# Patient Record
Sex: Male | Born: 1970
Health system: Southern US, Community
[De-identification: ages and names within clinical notes are randomized; demographics above are authoritative.]

## PROBLEM LIST (undated history)

## (undated) DIAGNOSIS — E119 Type 2 diabetes mellitus without complications: Secondary | ICD-10-CM

## (undated) DIAGNOSIS — I10 Essential (primary) hypertension: Secondary | ICD-10-CM

## (undated) DIAGNOSIS — K8020 Calculus of gallbladder without cholecystitis without obstruction: Secondary | ICD-10-CM

## (undated) DIAGNOSIS — L0291 Cutaneous abscess, unspecified: Secondary | ICD-10-CM

## (undated) HISTORY — PX: CARDIAC SURGERY: SHX584

---

## 2002-01-23 ENCOUNTER — Emergency Department (HOSPITAL_COMMUNITY): Admission: EM | Admit: 2002-01-23 | Discharge: 2002-01-23 | Payer: Self-pay | Admitting: Emergency Medicine

## 2003-04-09 ENCOUNTER — Emergency Department (HOSPITAL_COMMUNITY): Admission: EM | Admit: 2003-04-09 | Discharge: 2003-04-10 | Payer: Self-pay | Admitting: Emergency Medicine

## 2004-09-01 ENCOUNTER — Emergency Department (HOSPITAL_COMMUNITY): Admission: EM | Admit: 2004-09-01 | Discharge: 2004-09-01 | Payer: Self-pay | Admitting: Emergency Medicine

## 2012-01-28 ENCOUNTER — Encounter (HOSPITAL_COMMUNITY): Payer: Self-pay

## 2012-01-28 ENCOUNTER — Emergency Department (HOSPITAL_COMMUNITY)
Admission: EM | Admit: 2012-01-28 | Discharge: 2012-01-28 | Disposition: A | Discharge status: Home / Self Care | Payer: Non-veteran care | Attending: Emergency Medicine | Admitting: Emergency Medicine

## 2012-01-28 ENCOUNTER — Emergency Department (HOSPITAL_COMMUNITY): Payer: Non-veteran care

## 2012-01-28 DIAGNOSIS — K529 Noninfective gastroenteritis and colitis, unspecified: Secondary | ICD-10-CM

## 2012-01-28 DIAGNOSIS — R197 Diarrhea, unspecified: Secondary | ICD-10-CM | POA: Insufficient documentation

## 2012-01-28 DIAGNOSIS — K5289 Other specified noninfective gastroenteritis and colitis: Secondary | ICD-10-CM | POA: Insufficient documentation

## 2012-01-28 DIAGNOSIS — E119 Type 2 diabetes mellitus without complications: Secondary | ICD-10-CM | POA: Insufficient documentation

## 2012-01-28 DIAGNOSIS — Z794 Long term (current) use of insulin: Secondary | ICD-10-CM | POA: Insufficient documentation

## 2012-01-28 HISTORY — DX: Type 2 diabetes mellitus without complications: E11.9

## 2012-01-28 LAB — CBC
MCH: 27.6 pg (ref 26.0–34.0)
MCHC: 34.1 g/dL (ref 30.0–36.0)
Platelets: 171 10*3/uL (ref 150–400)
RBC: 5.29 MIL/uL (ref 4.22–5.81)
RDW: 12.9 % (ref 11.5–15.5)

## 2012-01-28 LAB — COMPREHENSIVE METABOLIC PANEL
ALT: 18 U/L (ref 0–53)
AST: 19 U/L (ref 0–37)
Calcium: 9.2 mg/dL (ref 8.4–10.5)
Creatinine, Ser: 0.96 mg/dL (ref 0.50–1.35)
Sodium: 136 mEq/L (ref 135–145)
Total Protein: 7.5 g/dL (ref 6.0–8.3)

## 2012-01-28 MED ORDER — IOHEXOL 300 MG/ML  SOLN
100.0000 mL | Freq: Once | INTRAMUSCULAR | Status: AC | PRN
  Administered 2012-01-28: 100 mL via INTRAVENOUS

## 2012-01-28 MED ORDER — SODIUM CHLORIDE 0.9 % IV BOLUS (SEPSIS)
1000.0000 mL | Freq: Once | INTRAVENOUS | Status: AC
  Administered 2012-01-28: 1000 mL via INTRAVENOUS

## 2012-01-28 NOTE — ED Notes (Signed)
Per pt I have had diarrhea for 3 days now. Afebrile. No nausea or vomiting. Diarrhea has had pungent odor. Brown loose consistency. Upper mid abdominal pain.  Bowel sounds all four quads.

## 2012-01-28 NOTE — ED Notes (Addendum)
Patient reports that he has had 20 diarrheal stools in the past 24 hours and having epigastric pain. Patient states that his diarrhea has a foul odor and a foul odor when he "burps". Patient denies vomiting or fever. Patient's friend states that she had something similar a week ago.

## 2012-01-28 NOTE — ED Provider Notes (Signed)
History     CSN: [id number]  Arrival date & time 01/28/12  1103   First MD Initiated Contact with Patient 01/28/12 1147      Chief Complaint  Patient presents with  . Abdominal Pain  . Diarrhea    (Consider location/radiation/quality/duration/timing/severity/associated sxs/prior treatment) HPI Pt presents with c/o multiple episodes of diarrhea over the past 1-2 days.  No blood in stool.  Has had some nausea, no vomiting.  Today he has had increased belching.  He also notes feeling lots of gas pains.  No fever/chills.  Diarrhea has slowed down as of early this morning.  His girlfriend notes that she has had similar symptoms that resolved.  Pt has not had any treatment prior to arrival.  There are no other associated systemic symptoms, there are no other alleviating or modifying factors.   Past Medical History  Diagnosis Date  . Diabetes mellitus without complication     History reviewed. No pertinent past surgical history.  Family History  Problem Relation Age of Onset  . Diabetes Mother   . Heart failure Father     History  Substance Use Topics  . Smoking status: Never Smoker   . Smokeless tobacco: Not on file  . Alcohol Use: No      Review of Systems ROS reviewed and all otherwise negative except for mentioned in HPI  Allergies  Food  Home Medications   Current Outpatient Rx  Name Route Sig Dispense Refill  . VITAMIN D 1000 UNITS PO TABS Oral Take 1,000 Units by mouth daily.    . INSULIN ASPART 100 UNIT/ML [place] SOLN Subcutaneous Inject 20-25 Units into the skin 3 (three) times daily before meals. Pt takes 20 units at breakfast 20 units at lunch 25 at bedtime    . INSULIN GLARGINE 100 UNIT/ML  SOLN Subcutaneous Inject 52 Units into the skin 2 (two) times daily.      BP 137/95  Pulse 80  Temp 98.5 F (36.9 C) (Oral)  Resp 18  SpO2 99% Vitals reviewed Physical Exam Physical Examination: General appearance - alert, well appearing, and in no  distress Mental status - alert, oriented to person, place, and time Eyes - no conjunctival injection, no scleral icterus Mouth - mucous membranes moist, pharynx normal without lesions Chest - clear to auscultation, no wheezes, rales or rhonchi, symmetric air entry Heart - normal rate, regular rhythm, normal S1, S2, no murmurs, rubs, clicks or gallops Abdomen - soft, nontender, nondistended, no masses or organomegaly Extremities - peripheral pulses normal, no pedal edema, no clubbing or cyanosis Skin - normal coloration and turgor, no rashes  ED Course  Procedures (including critical care time  Labs Reviewed  GLUCOSE, CAPILLARY - Abnormal; Notable for the following:    Glucose-Capillary 161 (*)     All other components within normal limits  COMPREHENSIVE METABOLIC PANEL - Abnormal; Notable for the following:    Glucose, Bld 153 (*)     All other components within normal limits  CBC  LIPASE, BLOOD  LAB REPORT - SCANNED   Ct Abdomen Pelvis W Contrast  01/28/2012  *RADIOLOGY REPORT*  Clinical Data: Abdominal pain, diarrhea  CT ABDOMEN AND PELVIS WITH CONTRAST  Technique:  Multidetector CT imaging of the abdomen and pelvis was performed following the standard protocol during bolus administration of intravenous contrast.  Contrast: OMNIPAQUE IOHEXOL 300 MG/ML  SOLN  Comparison: 01/28/2012 the  Findings: Dependent basilar atelectasis.  Normal heart size.  No pericardial or pleural effusion.  Abdomen:  Liver, gallbladder, biliary system, pancreas, spleen, adrenal glands, and kidneys are within normal limits and demonstrate no acute finding.  Punctate nonobstructing left upper pole renal calculus measuring 2 mm.  Mildly prominent scattered central mesenteric lymph nodes without definite pathologic enlargement or adenopathy.  Negative for bowel obstruction, dilatation, ileus pattern or free air.  Scattered small bowel air fluid levels without significant dilatation compatible with ongoing  diarrhea.  No bowel wall thickening demonstrated or visualized pneumatosis.  Normal appendix demonstrated.  Pelvis:  No pelvic free fluid, fluid collection, hemorrhage, adenopathy, inguinal abnormality, hernia.  Urinary bladder unremarkable.  IMPRESSION: No acute intra-abdominal or pelvic process by CT.   Original Report Authenticated By: Judie Petit. Ruel Favors, M.D.      1. Diarrhea   2. Enteritis       MDM  Pt with diarrhea x several days which has been improving today.  Pt here today with gas pains.  Xray showed possible signs of obstruction, no acute process on CT scan.  Pt tolerating fluids in the ED.  Discharged with strict return precautions.  Pt agreeable with plan.        Ethelda Chick, MD 01/30/12 1340

## 2012-03-22 ENCOUNTER — Emergency Department (HOSPITAL_COMMUNITY)
Admission: EM | Admit: 2012-03-22 | Discharge: 2012-03-22 | Disposition: A | Discharge status: Home / Self Care | Payer: Non-veteran care | Attending: Emergency Medicine | Admitting: Emergency Medicine

## 2012-03-22 ENCOUNTER — Encounter (HOSPITAL_COMMUNITY): Payer: Self-pay | Admitting: Emergency Medicine

## 2012-03-22 DIAGNOSIS — J111 Influenza due to unidentified influenza virus with other respiratory manifestations: Secondary | ICD-10-CM | POA: Insufficient documentation

## 2012-03-22 DIAGNOSIS — J069 Acute upper respiratory infection, unspecified: Secondary | ICD-10-CM | POA: Insufficient documentation

## 2012-03-22 DIAGNOSIS — R05 Cough: Secondary | ICD-10-CM | POA: Insufficient documentation

## 2012-03-22 DIAGNOSIS — Z794 Long term (current) use of insulin: Secondary | ICD-10-CM | POA: Insufficient documentation

## 2012-03-22 DIAGNOSIS — J029 Acute pharyngitis, unspecified: Secondary | ICD-10-CM | POA: Insufficient documentation

## 2012-03-22 DIAGNOSIS — E119 Type 2 diabetes mellitus without complications: Secondary | ICD-10-CM | POA: Insufficient documentation

## 2012-03-22 DIAGNOSIS — IMO0001 Reserved for inherently not codable concepts without codable children: Secondary | ICD-10-CM | POA: Insufficient documentation

## 2012-03-22 DIAGNOSIS — Z79899 Other long term (current) drug therapy: Secondary | ICD-10-CM | POA: Insufficient documentation

## 2012-03-22 DIAGNOSIS — R059 Cough, unspecified: Secondary | ICD-10-CM | POA: Insufficient documentation

## 2012-03-22 LAB — RAPID STREP SCREEN (MED CTR MEBANE ONLY): Streptococcus, Group A Screen (Direct): NEGATIVE

## 2012-03-22 MED ORDER — ACETAMINOPHEN 325 MG PO TABS
650.0000 mg | ORAL_TABLET | Freq: Once | ORAL | Status: AC
  Administered 2012-03-22: 650 mg via ORAL
  Filled 2012-03-22: qty 2

## 2012-03-22 MED ORDER — HYDROCODONE-ACETAMINOPHEN 7.5-325 MG/15ML PO SOLN: 15.0000 mL | Freq: Three times a day (TID) | ORAL | Status: DC | PRN

## 2012-03-22 MED ORDER — KETOROLAC TROMETHAMINE 60 MG/2ML IM SOLN
60.0000 mg | Freq: Once | INTRAMUSCULAR | Status: AC
  Administered 2012-03-22: 60 mg via INTRAMUSCULAR
  Filled 2012-03-22: qty 2

## 2012-03-22 MED ORDER — OSELTAMIVIR PHOSPHATE 75 MG PO CAPS: 75.0000 mg | ORAL_CAPSULE | Freq: Two times a day (BID) | ORAL | Status: DC

## 2012-03-22 NOTE — ED Notes (Signed)
MD at bedside. 

## 2012-03-22 NOTE — ED Provider Notes (Signed)
History     CSN: [id number]  Arrival date & time 03/22/12  0435   First MD Initiated Contact with Patient 03/22/12 [phone]      Chief Complaint  Patient presents with  . Sore Throat  . Fever    (Consider location/radiation/quality/duration/timing/severity/associated sxs/prior treatment) HPI Comments: 41 year old male with a history of diabetes who presents with a complaint of fever. He states this started last night when he got home from work, has been persistent, not improved with any interventions, associated with a sore throat and a cough. He has diffuse myalgias as well. He notes that he has a child who has similar symptoms.  Patient is a 41 y.o. male presenting with pharyngitis and fever. The history is provided by the patient and a relative.  Sore Throat  Fever Primary symptoms of the febrile illness include fever, cough and myalgias. Primary symptoms do not include nausea or vomiting.    Past Medical History  Diagnosis Date  . Diabetes mellitus without complication     History reviewed. No pertinent past surgical history.  Family History  Problem Relation Age of Onset  . Diabetes Mother   . Heart failure Father     History  Substance Use Topics  . Smoking status: Never Smoker   . Smokeless tobacco: Not on file  . Alcohol Use: No      Review of Systems  Constitutional: Positive for fever.  HENT: Positive for sore throat.   Respiratory: Positive for cough.   Gastrointestinal: Negative for nausea and vomiting.  Musculoskeletal: Positive for myalgias.    Allergies  Food  Home Medications   Current Outpatient Rx  Name  Route  Sig  Dispense  Refill  . VITAMIN D 1000 UNITS PO TABS   Oral   Take 1,000 Units by mouth daily.         . INSULIN ASPART 100 UNIT/ML [place] SOLN   Subcutaneous   Inject 20-25 Units into the skin 3 (three) times daily before meals. Pt takes 20 units at breakfast 20 units at lunch 25 at bedtime         . INSULIN GLARGINE 100  UNIT/ML [place] SOLN   Subcutaneous   Inject 52 Units into the skin 2 (two) times daily.         [place] HYDROCODONE-ACETAMINOPHEN 7.5-325 MG/15ML PO SOLN   Oral   Take 15 mLs by mouth every 8 (eight) hours as needed for pain.   120 mL   0   . OSELTAMIVIR PHOSPHATE 75 MG PO CAPS   Oral   Take 1 capsule (75 mg total) by mouth every 12 (twelve) hours.   10 capsule   0     BP 139/82  Pulse 104  Temp 100 F (37.8 C) (Oral)  Resp 20  Wt 285 lb (129.275 kg)  SpO2 95%  Physical Exam  Nursing note and vitals reviewed. Constitutional: He appears well-developed and well-nourished. No distress.  HENT:  Head: Normocephalic and atraumatic.  Mouth/Throat: No oropharyngeal exudate.       Pharynx is erythematous but there is no exudate, hypertrophy or asymmetry. Mucous membranes are moist  Eyes: Conjunctivae normal and EOM are normal. Pupils are equal, round, and reactive to light. Right eye exhibits no discharge. Left eye exhibits no discharge. No scleral icterus.  Neck: Normal range of motion. Neck supple. No JVD present. No thyromegaly present.       No cervical lymphadenopathy  Cardiovascular: Regular rhythm, normal heart sounds and intact distal pulses.  Exam reveals no gallop and no friction rub.   No murmur heard.      Mild tachycardia to 105  Pulmonary/Chest: Effort normal and breath sounds normal. No respiratory distress. He has no wheezes. He has no rales.       Lungs are clear bilaterally  Abdominal: Soft. Bowel sounds are normal. He exhibits no distension and no mass. There is no tenderness.  Musculoskeletal: Normal range of motion. He exhibits no edema and no tenderness.  Lymphadenopathy:    He has no cervical adenopathy.  Neurological: He is alert. Coordination normal.  Skin: Skin is warm and dry. No rash noted. No erythema.  Psychiatric: He has a normal mood and affect. His behavior is normal.    ED Course  Procedures (including critical care time)   Labs Reviewed  RAPID  STREP SCREEN   No results found.   1. Influenza-like illness       MDM  The patient has a flulike illness with fever and upper respiratory symptoms. He has a slight pharyngitis, the nurses have ordered a strep screen, and is in the lab at this time, Toradol and Tylenol given for fever and body aches, patient appears stable overall. Because he is a diabetic we'll treat with abx  Strep test negative, patient given Tamiflu and hydrocodone suspension. Stable for discharge      Vida Roller, MD 03/22/12 [phone]

## 2012-03-22 NOTE — ED Notes (Signed)
Pt alert, arrives from home, c/o sore throat and fever, onset was last PM, resp even unlabored, skin pwd

## 2016-04-28 ENCOUNTER — Emergency Department (HOSPITAL_BASED_OUTPATIENT_CLINIC_OR_DEPARTMENT_OTHER): Payer: Federal, State, Local not specified - PPO

## 2016-04-28 ENCOUNTER — Encounter (HOSPITAL_BASED_OUTPATIENT_CLINIC_OR_DEPARTMENT_OTHER): Payer: Self-pay | Admitting: *Deleted

## 2016-04-28 ENCOUNTER — Emergency Department (HOSPITAL_BASED_OUTPATIENT_CLINIC_OR_DEPARTMENT_OTHER)
Admission: EM | Admit: 2016-04-28 | Discharge: 2016-04-28 | Disposition: A | Discharge status: Home / Self Care | Payer: Federal, State, Local not specified - PPO | Attending: Emergency Medicine | Admitting: Emergency Medicine

## 2016-04-28 DIAGNOSIS — R110 Nausea: Secondary | ICD-10-CM | POA: Diagnosis not present

## 2016-04-28 DIAGNOSIS — E1165 Type 2 diabetes mellitus with hyperglycemia: Secondary | ICD-10-CM | POA: Insufficient documentation

## 2016-04-28 DIAGNOSIS — R079 Chest pain, unspecified: Secondary | ICD-10-CM | POA: Diagnosis not present

## 2016-04-28 DIAGNOSIS — R739 Hyperglycemia, unspecified: Secondary | ICD-10-CM

## 2016-04-28 DIAGNOSIS — R1011 Right upper quadrant pain: Secondary | ICD-10-CM | POA: Diagnosis present

## 2016-04-28 DIAGNOSIS — R1010 Upper abdominal pain, unspecified: Secondary | ICD-10-CM

## 2016-04-28 DIAGNOSIS — Z794 Long term (current) use of insulin: Secondary | ICD-10-CM | POA: Diagnosis not present

## 2016-04-28 DIAGNOSIS — Z79899 Other long term (current) drug therapy: Secondary | ICD-10-CM | POA: Insufficient documentation

## 2016-04-28 LAB — COMPREHENSIVE METABOLIC PANEL
ALK PHOS: 86 U/L (ref 38–126)
ALT: 17 U/L (ref 17–63)
ANION GAP: 8 (ref 5–15)
AST: 18 U/L (ref 15–41)
Albumin: 3.6 g/dL (ref 3.5–5.0)
BUN: 16 mg/dL (ref 6–20)
CALCIUM: 9.3 mg/dL (ref 8.9–10.3)
CO2: 25 mmol/L (ref 22–32)
Chloride: 104 mmol/L (ref 101–111)
Creatinine, Ser: 1.03 mg/dL (ref 0.61–1.24)
GFR calc non Af Amer: >60 mL/min (ref >60)
Glucose, Bld: 469 mg/dL — ABNORMAL HIGH (ref 65–99)
POTASSIUM: 3.8 mmol/L (ref 3.5–5.1)
SODIUM: 137 mmol/L (ref 135–145)
Total Bilirubin: 0.5 mg/dL (ref 0.3–1.2)
Total Protein: 7.2 g/dL (ref 6.5–8.1)

## 2016-04-28 LAB — CBC WITH DIFFERENTIAL/PLATELET
Basophils Absolute: 0.1 10*3/uL (ref 0.0–0.1)
Basophils Relative: 1 %
EOS ABS: 0.2 10*3/uL (ref 0.0–0.7)
Eosinophils Relative: 4 %
HCT: 44.3 % (ref 39.0–52.0)
HEMOGLOBIN: 15.5 g/dL (ref 13.0–17.0)
LYMPHS ABS: 2.2 10*3/uL (ref 0.7–4.0)
Lymphocytes Relative: 37 %
MCH: 27.7 pg (ref 26.0–34.0)
MCHC: 35 g/dL (ref 30.0–36.0)
MCV: 79.1 fL (ref 78.0–100.0)
Monocytes Absolute: 0.4 10*3/uL (ref 0.1–1.0)
Monocytes Relative: 7 %
NEUTROS ABS: 3 10*3/uL (ref 1.7–7.7)
NEUTROS PCT: 51 %
Platelets: 173 10*3/uL (ref 150–400)
RBC: 5.6 MIL/uL (ref 4.22–5.81)
RDW: 12.6 % (ref 11.5–15.5)
WBC: 5.8 10*3/uL (ref 4.0–10.5)

## 2016-04-28 LAB — URINALYSIS, ROUTINE W REFLEX MICROSCOPIC
Bilirubin Urine: NEGATIVE
Glucose, UA: >=500 mg/dL — AB
Ketones, ur: NEGATIVE mg/dL
Leukocytes, UA: NEGATIVE
NITRITE: NEGATIVE
Protein, ur: 30 mg/dL — AB
SPECIFIC GRAVITY, URINE: 1.043 — AB (ref 1.005–1.030)
pH: 5.5 (ref 5.0–8.0)

## 2016-04-28 LAB — LIPASE, BLOOD: Lipase: 37 U/L (ref 11–51)

## 2016-04-28 LAB — URINALYSIS, MICROSCOPIC (REFLEX)
BACTERIA UA: NONE SEEN
SQUAMOUS EPITHELIAL / LPF: NONE SEEN
WBC, UA: NONE SEEN WBC/hpf (ref 0–5)

## 2016-04-28 LAB — TROPONIN I

## 2016-04-28 LAB — CBG MONITORING, ED: Glucose-Capillary: 339 mg/dL — ABNORMAL HIGH (ref 65–99)

## 2016-04-28 MED ORDER — IOPAMIDOL (ISOVUE-300) INJECTION 61%
100.0000 mL | Freq: Once | INTRAVENOUS | Status: AC | PRN
  Administered 2016-04-28: 100 mL via INTRAVENOUS

## 2016-04-28 MED ORDER — TRAMADOL HCL 50 MG PO TABS: 50.0000 mg | ORAL_TABLET | Freq: Four times a day (QID) | ORAL | 0 refills | Status: DC | PRN

## 2016-04-28 MED ORDER — SODIUM CHLORIDE 0.9 % IV BOLUS (SEPSIS)
1000.0000 mL | Freq: Once | INTRAVENOUS | Status: AC
  Administered 2016-04-28: 1000 mL via INTRAVENOUS

## 2016-04-28 MED ORDER — SUCRALFATE 1 G PO TABS: 1.0000 g | ORAL_TABLET | Freq: Three times a day (TID) | ORAL | 0 refills | Status: DC

## 2016-04-28 MED ORDER — INSULIN ASPART 100 UNIT/ML ~~LOC~~ SOLN
10.0000 [IU] | Freq: Once | SUBCUTANEOUS | Status: AC
  Administered 2016-04-28: 10 [IU] via SUBCUTANEOUS
  Filled 2016-04-28: qty 1

## 2016-04-28 MED ORDER — MORPHINE SULFATE (PF) 4 MG/ML IV SOLN
4.0000 mg | Freq: Once | INTRAVENOUS | Status: AC
  Administered 2016-04-28: 4 mg via INTRAVENOUS
  Filled 2016-04-28: qty 1

## 2016-04-28 MED ORDER — ONDANSETRON 4 MG PO TBDP: ORAL_TABLET | ORAL | 0 refills | Status: DC

## 2016-04-28 MED ORDER — ONDANSETRON HCL 4 MG/2ML IJ SOLN
4.0000 mg | Freq: Once | INTRAMUSCULAR | Status: AC
  Administered 2016-04-28: 4 mg via INTRAVENOUS
  Filled 2016-04-28: qty 2

## 2016-04-28 NOTE — ED Triage Notes (Addendum)
rt chest pain radiating to rt arm w rt arm numbness onset last pm  Denies sob, no n/v  States feels like burning, and cant feel hand  Pain increased w movement

## 2016-04-28 NOTE — Discharge Instructions (Signed)
Your blood sugar is elevated. Please take meformin as prescribed.   Continue nexium as prescribed.   Add carafate to help with your stomach.   Take tramadol as needed for pain.   Take zofran as needed for nausea.   [facility] Consider endoscopy with a GI doctor to look at your stomach.   Return to ER if you have severe abdominal pain, vomiting, fever, chest pain

## 2016-04-28 NOTE — ED Provider Notes (Signed)
[facility] Provider Note   CSN: [id number] Arrival date & time: 04/28/16  0557     History   Chief Complaint Chief Complaint  Patient presents with  . Chest Pain    HPI Javier Suarez is a 46 y.o. male.  Patient is a 46 year old male with past medical history of diabetes and obesity. He presents for evaluation of right upper quadrant pain and pain beneath his right ribs. He states that this began approximately one week ago and has worsened. [facility] and given medication which has not helped. He reports nausea but no vomiting. He reports belching that is foul tasting. He denies any fevers or chills. He denies any shortness of breath. He states that his pain radiates into his right arm and tells me that his right hand feels "numb".   The history is provided by the patient.  Chest Pain   This is a new problem. Episode onset: 1 week ago. The problem occurs constantly. The problem has been rapidly worsening. The pain is associated with movement, raising an arm and coughing (Palpation). The pain is severe. The quality of the pain is described as sharp. The pain radiates to the right arm. Associated symptoms include nausea and numbness. Pertinent negatives include no cough, no dizziness, no fever, no palpitations and no shortness of breath. He has tried nothing for the symptoms. The treatment provided no relief.    Past Medical History:  Diagnosis Date  . Diabetes mellitus without complication (HCC)     There are no active problems to display for this patient.   History reviewed. No pertinent surgical history.     Home Medications    Prior to Admission medications   Medication Sig Start Date End Date Taking? Authorizing Provider  metFORMIN (GLUCOPHAGE) 500 MG tablet Take by mouth 2 (two) times daily with a meal.   Yes Historical Provider, MD  cholecalciferol (VITAMIN D) 1000 UNITS tablet Take 1,000 Units by mouth daily.     Historical Provider, MD  hydrocodone-acetaminophen (HYCET) 7.5-325 MG/15ML solution Take 15 mLs by mouth every 8 (eight) hours as needed for pain. 03/22/12   Eber HongBrian Miller, MD  insulin aspart (NOVOLOG) 100 UNIT/ML injection Inject 20-25 Units into the skin 3 (three) times daily before meals. Pt takes 20 units at breakfast 20 units at lunch 25 at bedtime    Historical Provider, MD  insulin glargine (LANTUS) 100 UNIT/ML injection Inject 52 Units into the skin 2 (two) times daily.    Historical Provider, MD  oseltamivir (TAMIFLU) 75 MG capsule Take 1 capsule (75 mg total) by mouth every 12 (twelve) hours. 03/22/12   Eber HongBrian Miller, MD    Family History Family History  Problem Relation Age of Onset  . Diabetes Mother   . Heart failure Father     Social History Social History  Substance Use Topics  . Smoking status: Never Smoker  . Smokeless tobacco: Not on file  . Alcohol use No     Allergies   Food   Review of Systems Review of Systems  Constitutional: Negative for fever.  Respiratory: Negative for cough and shortness of breath.   Cardiovascular: Positive for chest pain. Negative for palpitations.  Gastrointestinal: Positive for nausea.  Neurological: Positive for numbness. Negative for dizziness.  All other systems reviewed and are negative.    Physical Exam Updated Vital Signs BP (!) 150/127 (BP Location: Right Arm)   Pulse 96   Temp 98.8 F (37.1  C) (Oral)   Resp 20   Ht 6\' 1"  (1.854 m)   Wt 250 lb (113.4 kg)   SpO2 100%   BMI 32.98 kg/m   Physical Exam  Constitutional: He is oriented to person, place, and time. He appears well-developed and well-nourished. No distress.  HENT:  Head: Normocephalic and atraumatic.  Mouth/Throat: Oropharynx is clear and moist.  Neck: Normal range of motion. Neck supple.  Cardiovascular: Normal rate and regular rhythm.  Exam reveals no friction rub.   No murmur heard. Pulmonary/Chest: Effort normal and breath sounds normal. No  respiratory distress. He has no wheezes. He has no rales.  Abdominal: Soft. Bowel sounds are normal. He exhibits no distension. There is tenderness. There is no rebound and no guarding.  There is tenderness to palpation in the right upper quadrant.  Musculoskeletal: Normal range of motion. He exhibits no edema.  Neurological: He is alert and oriented to person, place, and time. Coordination normal.  Skin: Skin is warm and dry. He is not diaphoretic.  Nursing note and vitals reviewed.    ED Treatments / Results  Labs (all labs ordered are listed, but only abnormal results are displayed) Labs Reviewed  COMPREHENSIVE METABOLIC PANEL  LIPASE, BLOOD  CBC WITH DIFFERENTIAL/PLATELET  TROPONIN I    EKG  EKG Interpretation None       Radiology No results found.  Procedures Procedures (including critical care time)  Medications Ordered in ED Medications  ondansetron (ZOFRAN) injection 4 mg (not administered)  morphine 4 MG/ML injection 4 mg (not administered)     Initial Impression / Assessment and Plan / ED Course  I have reviewed the triage vital signs and the nursing notes.  Pertinent labs & imaging results that were available during my care of the patient were reviewed by me and considered in my medical decision making (see chart for details).  Clinical Course     Patient presents with right upper quadrant pain. His laboratory studies are reassuring, however he continues to report significant discomfort. He will undergo a CT scan to further evaluate the cause of his discomfort. Care will be signed out to Dr. Silverio Lay awaiting the results of this study.  Final Clinical Impressions(s) / ED Diagnoses   Final diagnoses:  None    New Prescriptions New Prescriptions   No medications on file     Geoffery Lyons, MD 05/01/16 2259

## 2016-04-28 NOTE — ED Provider Notes (Signed)
  Physical Exam  BP 130/98 (BP Location: Right Arm)   Pulse 92   Temp 98.8 F (37.1 C) (Oral)   Resp 18   Ht 6\' 1"  (1.854 m)   Wt 250 lb (113.4 kg)   SpO2 98%   BMI 32.98 kg/m   Physical Exam  ED Course  Procedures  MDM Patient care assumed at 7 am from Dr. Judd Lienelo. Patient has been having R sided chest pain, epigastric pain for the last week. [facility] and is on nexium. Patient has hx of DM but uncompliant with metformin. Sign out pending RUQ US and possible CT ab/pel, labs, and reassessment. Bedside US showed nl gallbladder. Still had pain so ordered CT ab/pel. CT showed hepatomegaly otherwise unremarkable. Labs showed glucose 469, nl AG. After 1 L NS bolus and regular insulin SQ, CBG down to 339. UA nl. Delta trop neg. Pain controlled. I think likely gastritis vs gastric ulcer from uncontrolled DM. Told him to take metformin twice daily as prescribed and continue nexium, add carafate as needed. Prn tramadol for pain. [facility] for further evaluation.       Javier Panderavid Hsienta Calven Gilkes, MD 04/28/16 [phone]

## 2016-05-07 ENCOUNTER — Telehealth (HOSPITAL_BASED_OUTPATIENT_CLINIC_OR_DEPARTMENT_OTHER): Payer: Self-pay | Admitting: *Deleted

## 2016-05-07 NOTE — Telephone Encounter (Signed)
Spoke with pharmacy in [place] [facility]. Requesting zofran RX written here be changed from ODT to regular tablets. VORB from Dr. Rubin PayorPickering to do so.

## 2016-05-22 ENCOUNTER — Emergency Department (HOSPITAL_BASED_OUTPATIENT_CLINIC_OR_DEPARTMENT_OTHER)
Admission: EM | Admit: 2016-05-22 | Discharge: 2016-05-22 | Disposition: A | Discharge status: Home / Self Care | Payer: Federal, State, Local not specified - PPO | Attending: Emergency Medicine | Admitting: Emergency Medicine

## 2016-05-22 ENCOUNTER — Emergency Department (HOSPITAL_BASED_OUTPATIENT_CLINIC_OR_DEPARTMENT_OTHER): Payer: Federal, State, Local not specified - PPO

## 2016-05-22 ENCOUNTER — Encounter (HOSPITAL_BASED_OUTPATIENT_CLINIC_OR_DEPARTMENT_OTHER): Payer: Self-pay | Admitting: Emergency Medicine

## 2016-05-22 DIAGNOSIS — R197 Diarrhea, unspecified: Secondary | ICD-10-CM | POA: Diagnosis not present

## 2016-05-22 DIAGNOSIS — E119 Type 2 diabetes mellitus without complications: Secondary | ICD-10-CM | POA: Diagnosis not present

## 2016-05-22 DIAGNOSIS — K921 Melena: Secondary | ICD-10-CM | POA: Insufficient documentation

## 2016-05-22 DIAGNOSIS — R112 Nausea with vomiting, unspecified: Secondary | ICD-10-CM | POA: Diagnosis not present

## 2016-05-22 DIAGNOSIS — Z79899 Other long term (current) drug therapy: Secondary | ICD-10-CM | POA: Insufficient documentation

## 2016-05-22 DIAGNOSIS — R1013 Epigastric pain: Secondary | ICD-10-CM | POA: Insufficient documentation

## 2016-05-22 DIAGNOSIS — Z794 Long term (current) use of insulin: Secondary | ICD-10-CM | POA: Insufficient documentation

## 2016-05-22 LAB — COMPREHENSIVE METABOLIC PANEL
ALT: 14 U/L — ABNORMAL LOW (ref 17–63)
AST: 15 U/L (ref 15–41)
Albumin: 3.3 g/dL — ABNORMAL LOW (ref 3.5–5.0)
Alkaline Phosphatase: 57 U/L (ref 38–126)
Anion gap: 5 (ref 5–15)
BUN: 18 mg/dL (ref 6–20)
CHLORIDE: 101 mmol/L (ref 101–111)
CO2: 29 mmol/L (ref 22–32)
Calcium: 8.8 mg/dL — ABNORMAL LOW (ref 8.9–10.3)
Creatinine, Ser: 1.07 mg/dL (ref 0.61–1.24)
Glucose, Bld: 378 mg/dL — ABNORMAL HIGH (ref 65–99)
POTASSIUM: 3.6 mmol/L (ref 3.5–5.1)
Sodium: 135 mmol/L (ref 135–145)
Total Bilirubin: 0.7 mg/dL (ref 0.3–1.2)
Total Protein: 6.6 g/dL (ref 6.5–8.1)

## 2016-05-22 LAB — CBC
HCT: 41.2 % (ref 39.0–52.0)
HEMOGLOBIN: 13.7 g/dL (ref 13.0–17.0)
MCH: 27.4 pg (ref 26.0–34.0)
MCHC: 33.3 g/dL (ref 30.0–36.0)
MCV: 82.4 fL (ref 78.0–100.0)
Platelets: 159 10*3/uL (ref 150–400)
RBC: 5 MIL/uL (ref 4.22–5.81)
RDW: 13 % (ref 11.5–15.5)
WBC: 4.8 10*3/uL (ref 4.0–10.5)

## 2016-05-22 LAB — URINALYSIS, ROUTINE W REFLEX MICROSCOPIC
Bilirubin Urine: NEGATIVE
Ketones, ur: NEGATIVE mg/dL
LEUKOCYTES UA: NEGATIVE
Nitrite: NEGATIVE
PH: 6 (ref 5.0–8.0)
PROTEIN: 100 mg/dL — AB
Specific Gravity, Urine: 1.046 — ABNORMAL HIGH (ref 1.005–1.030)

## 2016-05-22 LAB — URINALYSIS, MICROSCOPIC (REFLEX)

## 2016-05-22 LAB — CBG MONITORING, ED: GLUCOSE-CAPILLARY: 265 mg/dL — AB (ref 65–99)

## 2016-05-22 LAB — LIPASE, BLOOD: LIPASE: 45 U/L (ref 11–51)

## 2016-05-22 LAB — OCCULT BLOOD X 1 CARD TO LAB, STOOL: Fecal Occult Bld: NEGATIVE

## 2016-05-22 MED ORDER — LOPERAMIDE HCL 2 MG PO CAPS: 2.0000 mg | ORAL_CAPSULE | Freq: Four times a day (QID) | ORAL | 0 refills | Status: DC | PRN

## 2016-05-22 MED ORDER — SODIUM CHLORIDE 0.9 % IV BOLUS (SEPSIS)
1000.0000 mL | Freq: Once | INTRAVENOUS | Status: AC
  Administered 2016-05-22: 1000 mL via INTRAVENOUS

## 2016-05-22 MED ORDER — FAMOTIDINE IN NACL 20-0.9 MG/50ML-% IV SOLN
20.0000 mg | Freq: Once | INTRAVENOUS | Status: AC
  Administered 2016-05-22: 20 mg via INTRAVENOUS
  Filled 2016-05-22: qty 50

## 2016-05-22 MED ORDER — GLIPIZIDE 10 MG PO TABS: 10.0000 mg | ORAL_TABLET | Freq: Every day | ORAL | 0 refills | Status: DC

## 2016-05-22 MED ORDER — PANTOPRAZOLE SODIUM 40 MG IV SOLR
40.0000 mg | Freq: Once | INTRAVENOUS | Status: AC
  Administered 2016-05-22: 40 mg via INTRAVENOUS
  Filled 2016-05-22: qty 40

## 2016-05-22 MED ORDER — INSULIN REGULAR HUMAN 100 UNIT/ML IJ SOLN
5.0000 [IU] | Freq: Once | INTRAMUSCULAR | Status: AC
  Administered 2016-05-22: 5 [IU] via SUBCUTANEOUS
  Filled 2016-05-22: qty 1

## 2016-05-22 MED ORDER — ONDANSETRON HCL 4 MG/2ML IJ SOLN
4.0000 mg | Freq: Once | INTRAMUSCULAR | Status: DC
Start: 2016-05-22 — End: 2016-05-22
  Filled 2016-05-22: qty 2

## 2016-05-22 MED ORDER — GI COCKTAIL ~~LOC~~
30.0000 mL | Freq: Once | ORAL | Status: AC
  Administered 2016-05-22: 30 mL via ORAL
  Filled 2016-05-22: qty 30

## 2016-05-22 MED ORDER — SUCRALFATE 1 G PO TABS: 1.0000 g | ORAL_TABLET | Freq: Three times a day (TID) | ORAL | 0 refills | Status: DC

## 2016-05-22 NOTE — ED Notes (Signed)
Patient transported to X-ray 

## 2016-05-22 NOTE — Discharge Instructions (Signed)
Continue metformin.   Add glipizide to control blood sugar.   Stay hydrated.   Continue nexium. Take carafate as prescribed.   Take imodium for diarrhea, up to 10 times daily.   See your GI doctor  Return to ER if you have worse vomiting, abdominal pain, fevers, worse bloody or black stools.

## 2016-05-22 NOTE — ED Triage Notes (Signed)
Vomiting and diarrhea since last night.  Diarrhea is black.

## 2016-05-22 NOTE — ED Provider Notes (Signed)
[facility] Provider Note   CSN: [id number] Arrival date & time: 05/22/16  1635  By signing my name below, I, Modena Jansky, attest that this documentation has been prepared under the direction and in the presence of Charlynne Pander, MD. Electronically Signed: Modena Jansky, Scribe. 05/22/2016. 6:05 PM.  History   Chief Complaint Chief Complaint  Patient presents with  . Emesis   The history is provided by the patient and medical records. No language interpreter was used.   HPI Comments: Javier Suarez is a 46 y.o. male with a PMHx of DM who presents to the Emergency Department complaining of intermittent vomiting that started last night. He was seen in the ED 2 weeks ago for abdominal pain and uncontrolled blood sugar. He filled his metformin prescribed then and he is currently on nexium. He hasn't seen GI yet but has an upcoming appointment with GI at the [place].  He states he vomited twice last night and another two times today. He describes the emesis as red in color. He has been having multiple episodes of associated diarrhea, described as black. He had black stools last night and today. No modifying factors. He denies any recent travel or other complaints. Not on blood thinners. Seen in the ED 2 weeks ago and had CT ab/pel that was unremarkable.   Past Medical History:  Diagnosis Date  . Diabetes mellitus without complication (HCC)     There are no active problems to display for this patient.   History reviewed. No pertinent surgical history.     Home Medications    Prior to Admission medications   Medication Sig Start Date End Date Taking? Authorizing Provider  Liraglutide (VICTOZA [place]) Inject into the skin.   Yes Historical Provider, MD  cholecalciferol (VITAMIN D) 1000 UNITS tablet Take 1,000 Units by mouth daily.    Historical Provider, MD  hydrocodone-acetaminophen (HYCET) 7.5-325 MG/15ML solution Take 15 mLs by mouth every 8 (eight) hours as needed for pain.  03/22/12   Eber Hong, MD  insulin aspart (NOVOLOG) 100 UNIT/ML injection Inject 20-25 Units into the skin 3 (three) times daily before meals. Pt takes 20 units at breakfast 20 units at lunch 25 at bedtime    Historical Provider, MD  insulin glargine (LANTUS) 100 UNIT/ML injection Inject 52 Units into the skin 2 (two) times daily.    Historical Provider, MD  metFORMIN (GLUCOPHAGE) 500 MG tablet Take by mouth 2 (two) times daily with a meal.    Historical Provider, MD  omeprazole (PRILOSEC) 20 MG capsule Take 20 mg by mouth daily.    Historical Provider, MD  ondansetron (ZOFRAN ODT) 4 MG disintegrating tablet 4mg  ODT q6 hours prn nausea/vomit 04/28/16   Charlynne Pander, MD  oseltamivir (TAMIFLU) 75 MG capsule Take 1 capsule (75 mg total) by mouth every 12 (twelve) hours. 03/22/12   Eber Hong, MD  sucralfate (CARAFATE) 1 g tablet Take 1 tablet (1 g total) by mouth 4 (four) times daily -  with meals and at bedtime. 04/28/16   Charlynne Pander, MD  traMADol (ULTRAM) 50 MG tablet Take 1 tablet (50 mg total) by mouth every 6 (six) hours as needed. 04/28/16   Charlynne Pander, MD    Family History Family History  Problem Relation Age of Onset  . Diabetes Mother   . Heart failure Father     Social History Social History  Substance Use Topics  . Smoking status: Never Smoker  . Smokeless tobacco: Never Used  .  Alcohol use No     Allergies   Food   Review of Systems Review of Systems  Gastrointestinal: Positive for blood in stool, diarrhea and vomiting.   Physical Exam Updated Vital Signs BP (!) 149/105 (BP Location: Right Arm)   Pulse 92   Temp 98.4 F (36.9 C) (Oral)   Resp 16   Ht 6\' 1"  (1.854 m)   Wt 245 lb (111.1 kg)   SpO2 99%   BMI 32.32 kg/m   Physical Exam  Constitutional: He appears well-developed and well-nourished. No distress.  HENT:  Head: Normocephalic and atraumatic.  Mouth/Throat: Mucous membranes are not dry.  Eyes: Conjunctivae are normal.  Neck: Neck  supple.  Cardiovascular: Normal rate.   Pulmonary/Chest: Effort normal.  Abdominal: Soft. There is tenderness.  Mild epigastric TTP.   Genitourinary:  Genitourinary Comments: Rectal exam: Dark stools, no hemorrhoids.  Musculoskeletal: Normal range of motion.  Neurological: He is alert.  Skin: Skin is warm and dry.  Psychiatric: He has a normal mood and affect.  Nursing note and vitals reviewed.    ED Treatments / Results  DIAGNOSTIC STUDIES: Oxygen Saturation is 99% on RA, normal by my interpretation.    COORDINATION OF CARE: 6:09 PM- Pt advised of plan for treatment and pt agrees.  Labs (all labs ordered are listed, but only abnormal results are displayed) Labs Reviewed  COMPREHENSIVE METABOLIC PANEL - Abnormal; Notable for the following:       Result Value   Glucose, Bld 378 (*)    Calcium 8.8 (*)    Albumin 3.3 (*)    ALT 14 (*)    All other components within normal limits  LIPASE, BLOOD  CBC  URINALYSIS, ROUTINE W REFLEX MICROSCOPIC  OCCULT BLOOD X 1 CARD TO LAB, STOOL    EKG  EKG Interpretation None       Radiology No results found.  Procedures Procedures (including critical care time)  Medications Ordered in ED Medications  ondansetron (ZOFRAN) injection 4 mg (not administered)  famotidine (PEPCID) IVPB 20 mg premix (20 mg Intravenous New Bag/Given 05/22/16 1819)  sodium chloride 0.9 % bolus 1,000 mL (0 mLs Intravenous Stopped 05/22/16 1815)  insulin regular (NOVOLIN R,HUMULIN R) 250 units/2.235mL (100 units/mL) injection 5 Units (5 Units Subcutaneous Given 05/22/16 1815)  pantoprazole (PROTONIX) injection 40 mg (40 mg Intravenous Given 05/22/16 1815)  gi cocktail (Maalox,Lidocaine,Donnatal) (30 mLs Oral Given 05/22/16 1819)     Initial Impression / Assessment and Plan / ED Course  I have reviewed the triage vital signs and the nursing notes.  Pertinent labs & imaging results that were available during my care of the patient were reviewed by me and  considered in my medical decision making (see chart for details).     Javier Suarez is a 46 y.o. male here with vomiting, diarrhea with dark stool. Likely gastroenteritis. Consider bleeding ulcer as well. Abdomen nontender. Will get labs, occ stool.   8:13 PM CBC showed Hg 13.7, similar to 2 weeks ago.Occ negative. Glucose 378, nl AG, dec to 265 after IVF and insulin. Will add glipizide to control diabetes and add carafate. No vomiting in the ED. He is already on nexium and has GI follow up. I think likely mild gastritis causing his symptoms.    Final Clinical Impressions(s) / ED Diagnoses   Final diagnoses:  None    New Prescriptions New Prescriptions   No medications on file   I personally performed the services described in this documentation, which  was scribed in my presence. The recorded information has been reviewed and is accurate.     Charlynne Pander, MD 05/22/16 Elisha Ponder

## 2016-09-06 ENCOUNTER — Emergency Department (HOSPITAL_BASED_OUTPATIENT_CLINIC_OR_DEPARTMENT_OTHER): Payer: Federal, State, Local not specified - PPO

## 2016-09-06 ENCOUNTER — Encounter (HOSPITAL_BASED_OUTPATIENT_CLINIC_OR_DEPARTMENT_OTHER): Payer: Self-pay

## 2016-09-06 ENCOUNTER — Emergency Department (HOSPITAL_BASED_OUTPATIENT_CLINIC_OR_DEPARTMENT_OTHER)
Admission: EM | Admit: 2016-09-06 | Discharge: 2016-09-07 | Disposition: A | Discharge status: Home / Self Care | Payer: Federal, State, Local not specified - PPO | Attending: Emergency Medicine | Admitting: Emergency Medicine

## 2016-09-06 DIAGNOSIS — Z794 Long term (current) use of insulin: Secondary | ICD-10-CM | POA: Diagnosis not present

## 2016-09-06 DIAGNOSIS — Z79899 Other long term (current) drug therapy: Secondary | ICD-10-CM | POA: Insufficient documentation

## 2016-09-06 DIAGNOSIS — I10 Essential (primary) hypertension: Secondary | ICD-10-CM | POA: Diagnosis not present

## 2016-09-06 DIAGNOSIS — R1011 Right upper quadrant pain: Secondary | ICD-10-CM

## 2016-09-06 DIAGNOSIS — E119 Type 2 diabetes mellitus without complications: Secondary | ICD-10-CM | POA: Insufficient documentation

## 2016-09-06 HISTORY — DX: Calculus of gallbladder without cholecystitis without obstruction: K80.20

## 2016-09-06 LAB — COMPREHENSIVE METABOLIC PANEL
ALBUMIN: 3.7 g/dL (ref 3.5–5.0)
ALK PHOS: 72 U/L (ref 38–126)
ALT: 17 U/L (ref 17–63)
AST: 17 U/L (ref 15–41)
Anion gap: 7 (ref 5–15)
BILIRUBIN TOTAL: 0.2 mg/dL — AB (ref 0.3–1.2)
BUN: 17 mg/dL (ref 6–20)
CALCIUM: 9.2 mg/dL (ref 8.9–10.3)
CO2: 30 mmol/L (ref 22–32)
Chloride: 100 mmol/L — ABNORMAL LOW (ref 101–111)
Creatinine, Ser: 1.16 mg/dL (ref 0.61–1.24)
GFR calc Af Amer: >60 mL/min (ref >60)
GLUCOSE: 329 mg/dL — AB (ref 65–99)
POTASSIUM: 4 mmol/L (ref 3.5–5.1)
Sodium: 137 mmol/L (ref 135–145)
TOTAL PROTEIN: 7 g/dL (ref 6.5–8.1)

## 2016-09-06 LAB — CBC
HEMATOCRIT: 41.6 % (ref 39.0–52.0)
Hemoglobin: 14.6 g/dL (ref 13.0–17.0)
MCH: 28.3 pg (ref 26.0–34.0)
MCHC: 35.1 g/dL (ref 30.0–36.0)
MCV: 80.6 fL (ref 78.0–100.0)
PLATELETS: 174 10*3/uL (ref 150–400)
RBC: 5.16 MIL/uL (ref 4.22–5.81)
RDW: 13.2 % (ref 11.5–15.5)
WBC: 5.7 10*3/uL (ref 4.0–10.5)

## 2016-09-06 LAB — LIPASE, BLOOD: Lipase: 30 U/L (ref 11–51)

## 2016-09-06 LAB — URINALYSIS, MICROSCOPIC (REFLEX): WBC UA: NONE SEEN WBC/hpf (ref 0–5)

## 2016-09-06 LAB — URINALYSIS, ROUTINE W REFLEX MICROSCOPIC
BILIRUBIN URINE: NEGATIVE
KETONES UR: NEGATIVE mg/dL
Leukocytes, UA: NEGATIVE
Nitrite: NEGATIVE
PH: 6 (ref 5.0–8.0)
Protein, ur: 100 mg/dL — AB
SPECIFIC GRAVITY, URINE: 1.031 — AB (ref 1.005–1.030)

## 2016-09-06 MED ORDER — SODIUM CHLORIDE 0.9 % IV BOLUS (SEPSIS): 1000.0000 mL | Freq: Once | INTRAVENOUS | Status: DC

## 2016-09-06 NOTE — ED Provider Notes (Signed)
[facility] Note   CSN: [id number] Arrival date & time: 09/06/16  2029  By signing my name below, I, Cynda Acres, attest that this documentation has been prepared under the direction and in the presence of Mathews Robinsons, PA-C. Electronically Signed: Cynda Acres, Scribe. 09/06/16. 9:29 PM.  History   Chief Complaint Chief Complaint  Patient presents with  . Abdominal Pain   HPI Comments: Javier Suarez is a 46 y.o. male with a history of chronic right-sided abdominal pain, gallstones, HTN, and DM, who presents to the Emergency Department complaining of sudden-onset, intermittent RUQ abdominal pain that began earlier tonight at 6:30 pm. Patient states after mowing the grass he went to pick up a non-heavy gas can, when he began having severe right sided abdominal pain. Abdominal pain is exacerbated by movement. Patient states two weeks ago he had an [place] at the [facility]. No additional symptoms. No modifying factors indicated. Last BM was yesterday. Patient denies any fever, chills, nausea, vomiting, diarrhea, constipation, flank pain, or groin/testicle pain. No history of abdominal surgeries.   The history is provided by the patient. No language interpreter was used.    Past Medical History:  Diagnosis Date  . Diabetes mellitus without complication (HCC)   . Gallstones     There are no active problems to display for this patient.   History reviewed. No pertinent surgical history.     Home Medications    Prior to Admission medications   Medication Sig Start Date End Date Taking? Authorizing Provider  cholecalciferol (VITAMIN D) 1000 UNITS tablet Take 1,000 Units by mouth daily.    [provider]  glipiZIDE (GLUCOTROL) 10 MG tablet Take 1 tablet (10 mg total) by mouth daily before breakfast. 05/22/16   Charlynne Pander, MD  hydrocodone-acetaminophen (HYCET) 7.5-325 MG/15ML solution Take 15 mLs by mouth every 8 (eight) hours as needed for  pain. 03/22/12   Eber Hong, MD  insulin aspart (NOVOLOG) 100 UNIT/ML injection Inject 20-25 Units into the skin 3 (three) times daily before meals. Pt takes 20 units at breakfast 20 units at lunch 25 at bedtime    [provider]  insulin glargine (LANTUS) 100 UNIT/ML injection Inject 52 Units into the skin 2 (two) times daily.    [provider]  Liraglutide (VICTOZA [place]) Inject into the skin.    [provider]  loperamide (IMODIUM) 2 MG capsule Take 1 capsule (2 mg total) by mouth 4 (four) times daily as needed for diarrhea or loose stools. 05/22/16   Charlynne Pander, MD  metFORMIN (GLUCOPHAGE) 500 MG tablet Take by mouth 2 (two) times daily with a meal.    [provider]  omeprazole (PRILOSEC) 20 MG capsule Take 20 mg by mouth daily.    [provider]  ondansetron (ZOFRAN ODT) 4 MG disintegrating tablet 4mg  ODT q6 hours prn nausea/vomit 04/28/16   Charlynne Pander, MD  oseltamivir (TAMIFLU) 75 MG capsule Take 1 capsule (75 mg total) by mouth every 12 (twelve) hours. 03/22/12   Eber Hong, MD  sucralfate (CARAFATE) 1 g tablet Take 1 tablet (1 g total) by mouth 4 (four) times daily -  with meals and at bedtime. 05/22/16   Charlynne Pander, MD  traMADol (ULTRAM) 50 MG tablet Take 1 tablet (50 mg total) by mouth every 6 (six) hours as needed. 04/28/16   Charlynne Pander, MD    Family History Family History  Problem Relation Age of Onset  . Diabetes  Mother   . Heart failure Father     Social History Social History  Substance Use Topics  . Smoking status: Never Smoker  . Smokeless tobacco: Never Used  . Alcohol use No     Allergies   Food   Review of Systems Review of Systems  Constitutional: Negative for chills and fever.  Respiratory: Negative for cough, shortness of breath, wheezing and stridor.   Cardiovascular: Negative for chest pain and palpitations.  Gastrointestinal: Positive for abdominal pain. Negative for abdominal  distention, blood in stool, diarrhea, nausea and vomiting.  Genitourinary: Negative for difficulty urinating, dysuria, flank pain, hematuria and testicular pain.  Musculoskeletal: Negative for myalgias, neck pain and neck stiffness.  Skin: Negative for color change, pallor and rash.     Physical Exam Updated Vital Signs BP (!) 161/107 (BP Location: Right Arm)   Pulse 75   Temp 98.1 F (36.7 C) (Oral)   Resp 17   Ht 6\' 1"  (1.854 m)   Wt 119.8 kg (264 lb 1.6 oz)   SpO2 99%   BMI 34.84 kg/m   Physical Exam  Constitutional: He is oriented to person, place, and time. He appears well-developed and well-nourished. No distress.  Patient is afebrile, non-toxic appearing, seated comfortably in chair in no acute distress.   HENT:  Head: Normocephalic and atraumatic.  Eyes: EOM are normal. Right eye exhibits no discharge. Left eye exhibits no discharge. No scleral icterus.  Neck: Normal range of motion.  Cardiovascular: Normal rate, regular rhythm, normal heart sounds and intact distal pulses.   Pulmonary/Chest: Effort normal and breath sounds normal. No respiratory distress. He has no wheezes.  Abdominal: Soft. Bowel sounds are normal. He exhibits no distension and no mass. There is tenderness. There is no rebound and no guarding.  RUQ tenderness. Positive Murphy sign. No peritoneal signs, negative mcBurney's point tenderness, no rebound.  Musculoskeletal: Normal range of motion.  Neurological: He is alert and oriented to person, place, and time.  Skin: Skin is warm and dry. He is not diaphoretic.  Psychiatric: He has a normal mood and affect. Judgment normal.  Nursing note and vitals reviewed.    ED Treatments / Results  DIAGNOSTIC STUDIES: Oxygen Saturation is 100% on RA, normal by my interpretation.    COORDINATION OF CARE: 9:28 PM Discussed treatment plan with pt at bedside and pt agreed to plan, which includes an abdominal CT.  Labs (all labs ordered are listed, but only  abnormal results are displayed) Labs Reviewed  URINALYSIS, ROUTINE W REFLEX MICROSCOPIC - Abnormal; Notable for the following:       Result Value   Specific Gravity, Urine 1.031 (*)    Glucose, UA >=500 (*)    Hgb urine dipstick TRACE (*)    Protein, ur 100 (*)    All other components within normal limits  COMPREHENSIVE METABOLIC PANEL - Abnormal; Notable for the following:    Chloride 100 (*)    Glucose, Bld 329 (*)    Total Bilirubin 0.2 (*)    All other components within normal limits  URINALYSIS, MICROSCOPIC (REFLEX) - Abnormal; Notable for the following:    Bacteria, UA RARE (*)    Squamous Epithelial / LPF 0-5 (*)    All other components within normal limits  LIPASE, BLOOD  CBC    EKG  EKG Interpretation None       Radiology Dg Chest 2 View  Result Date: 09/06/2016 CLINICAL DATA:  Right upper quadrant pain with right lower rib pain.  No known injury. Twisting motion earlier today. History of gallstones and diabetes. EXAM: CHEST  2 VIEW COMPARISON:  05/22/2016 FINDINGS: Shallow inspiration. Normal heart size and pulmonary vascularity. No focal airspace disease or consolidation in the lungs. No blunting of costophrenic angles. No pneumothorax. Mediastinal contours appear intact. IMPRESSION: No active cardiopulmonary disease. Electronically Signed   By: Burman Nieves M.D.   On: 09/06/2016 22:14   US Abdomen Limited  Result Date: 09/06/2016 CLINICAL DATA:  Right upper quadrant pain EXAM: US ABDOMEN LIMITED - RIGHT UPPER QUADRANT COMPARISON:  CT 04/28/2016 FINDINGS: Gallbladder: No shadowing stones or sludge. Wall thickness upper normal, gallbladder slightly contracted in appearance. Negative sonographic Eulah Pont but patient given pain medication. Common bile duct: Diameter: Normal at 1.6 mm Liver: No focal lesion identified. Within normal limits in parenchymal echogenicity. IMPRESSION: Slightly contracted gallbladder likely accounts for borderline wall thickening. Otherwise  negative right upper quadrant abdominal ultrasound Electronically Signed   By: Jasmine Pang M.D.   On: 09/06/2016 22:22    Procedures Procedures (including critical care time)  Medications Ordered in ED Medications - No data to display   Initial Impression / Assessment and Plan / ED Course  I have reviewed the triage vital signs and the nursing notes.  Pertinent labs & imaging results that were available during my care of the patient were reviewed by me and considered in my medical decision making (see chart for details).     Patient presents with chronic right upper quadrant pain. As been seen at the [place] in CT scan obtaining last month. He then had a ultrasound and is going to follow up at his appointment for what sounds like a HIDA scan.  Here he has tenderness palpation of the right upper quadrant obtain ultrasound negative for cholecystitis. Patient reported that the pain started after twisting and bending over and is exacerbated by movement. He is otherwise comfortable at rest and experiences no pain.  Patient is nontoxic, nonseptic appearing, in no apparent distress. Patient's pain and other symptoms adequately managed in emergency department.  Labs, imaging and vitals reviewed.  Patient does not meet the SIRS or Sepsis criteria.    On repeat exam patient does not have a surgical abdomen and there are no peritoneal signs.  No indication of appendicitis, bowel obstruction, bowel perforation, cholecystitis, or diverticulitis. Chest x-ray negative, no chest pain, shortness of breath, or epigastric region discomfort. Pain is restricted to the right upper quadrant. Patient declined EKG at this time. He was ready to go home and asked for a note to be out of work for a couple days.  Patient discharged home with symptomatic treatment and given strict instructions for follow-up with their primary care physician.  I have also discussed reasons to return immediately to the ER.  Patient expresses  understanding and agrees with plan. Discussed elevated blood pressure and glucose with patient. He states that he has not taken any of his medications today.   Discussed strict return precautions and advised to return to the emergency department if experiencing any new or worsening symptoms. Instructions were understood and patient agreed with discharge plan. Final Clinical Impressions(s) / ED Diagnoses   Final diagnoses:  Right upper quadrant abdominal pain    New Prescriptions New Prescriptions   No medications on file   I personally performed the services described in this documentation, which was scribed in my presence. The recorded information has been reviewed and is accurate.     Mathews Robinsons B, PA-C 09/07/16 [phone]  Benjiman Core, MD 09/10/16 [phone]

## 2016-09-06 NOTE — ED Triage Notes (Addendum)
C/o pain to right side of abd-started after bending over today approx 630-pt with hx of abd pain since Nov 2017-[facility]April to check GB vs "pulled muscle"-steady gait-griamcing

## 2016-09-07 NOTE — ED Notes (Signed)
PT to take his personal BP med and DM meds when he gets home. PT to see PCP Tuesday and states he will take his meds as prescribed.

## 2016-09-07 NOTE — Discharge Instructions (Signed)
As discussed, follow-up acute gastroenterology appointment. Stay well hydrated and keep your urine clear. Taking medication as prescribed.  Return to the emergency department if he experienced chest pain, shortness of breath, nausea, vomiting or any other new concerning symptoms in the meantime

## 2016-09-07 NOTE — ED Notes (Signed)
Notified PA of BP that is 158/110.

## 2017-07-02 ENCOUNTER — Other Ambulatory Visit: Payer: Self-pay

## 2017-07-02 ENCOUNTER — Emergency Department (HOSPITAL_BASED_OUTPATIENT_CLINIC_OR_DEPARTMENT_OTHER)
Admission: EM | Admit: 2017-07-02 | Discharge: 2017-07-02 | Disposition: A | Discharge status: Home / Self Care | Payer: Federal, State, Local not specified - PPO | Attending: Emergency Medicine | Admitting: Emergency Medicine

## 2017-07-02 ENCOUNTER — Encounter (HOSPITAL_BASED_OUTPATIENT_CLINIC_OR_DEPARTMENT_OTHER): Payer: Self-pay | Admitting: Emergency Medicine

## 2017-07-02 DIAGNOSIS — E119 Type 2 diabetes mellitus without complications: Secondary | ICD-10-CM | POA: Diagnosis not present

## 2017-07-02 DIAGNOSIS — R112 Nausea with vomiting, unspecified: Secondary | ICD-10-CM

## 2017-07-02 DIAGNOSIS — Z7984 Long term (current) use of oral hypoglycemic drugs: Secondary | ICD-10-CM | POA: Diagnosis not present

## 2017-07-02 DIAGNOSIS — R197 Diarrhea, unspecified: Secondary | ICD-10-CM | POA: Diagnosis not present

## 2017-07-02 DIAGNOSIS — Z7902 Long term (current) use of antithrombotics/antiplatelets: Secondary | ICD-10-CM | POA: Insufficient documentation

## 2017-07-02 DIAGNOSIS — Z79899 Other long term (current) drug therapy: Secondary | ICD-10-CM | POA: Insufficient documentation

## 2017-07-02 LAB — CBC
HEMATOCRIT: 47.6 % (ref 39.0–52.0)
Hemoglobin: 15.5 g/dL (ref 13.0–17.0)
MCH: 28 pg (ref 26.0–34.0)
MCHC: 32.6 g/dL (ref 30.0–36.0)
MCV: 86.1 fL (ref 78.0–100.0)
Platelets: 201 10*3/uL (ref 150–400)
RBC: 5.53 MIL/uL (ref 4.22–5.81)
RDW: 13.8 % (ref 11.5–15.5)
WBC: 5.4 10*3/uL (ref 4.0–10.5)

## 2017-07-02 LAB — COMPREHENSIVE METABOLIC PANEL
ALK PHOS: 58 U/L (ref 38–126)
ALT: 20 U/L (ref 17–63)
AST: 21 U/L (ref 15–41)
Albumin: 4.5 g/dL (ref 3.5–5.0)
Anion gap: 12 (ref 5–15)
BILIRUBIN TOTAL: 0.7 mg/dL (ref 0.3–1.2)
BUN: 23 mg/dL — AB (ref 6–20)
CO2: 24 mmol/L (ref 22–32)
CREATININE: 1.45 mg/dL — AB (ref 0.61–1.24)
Calcium: 9.7 mg/dL (ref 8.9–10.3)
Chloride: 104 mmol/L (ref 101–111)
GFR calc Af Amer: >60 mL/min (ref >60)
GFR, EST NON AFRICAN AMERICAN: 57 mL/min — AB (ref >60)
Glucose, Bld: 166 mg/dL — ABNORMAL HIGH (ref 65–99)
Potassium: 4 mmol/L (ref 3.5–5.1)
Sodium: 140 mmol/L (ref 135–145)
TOTAL PROTEIN: 8.6 g/dL — AB (ref 6.5–8.1)

## 2017-07-02 LAB — CBG MONITORING, ED: Glucose-Capillary: 160 mg/dL — ABNORMAL HIGH (ref 65–99)

## 2017-07-02 NOTE — ED Provider Notes (Signed)
[facility] EMERGENCY DEPARTMENT Provider Note  CSN: [id number] Arrival date & time: 07/02/17 1191  Chief Complaint(s) Emesis and Diarrhea  HPI Javier Suarez is a 47 y.o. male with a history of diabetes here with 1 week of nausea, vomiting, diarrhea.  Reports positive sick contacts at home with children had similar symptoms.  Emesis is nonbloody nonbilious.  Has been able to tolerate oral hydration and nutrition.  Diarrhea is watery/loose, malodorous, nonbloody.  He denies any abdominal pain.  Denies any fevers but endorses chills.  No myalgias.  Patient was seen by [facility] and prescribed promethazine and Atropine for symptomatic relief. Denies other physical symptoms.  HPI  Past Medical History Past Medical History:  Diagnosis Date  . Diabetes mellitus without complication (HCC)   . Gallstones    There are no active problems to display for this patient.  Home Medication(s) Prior to Admission medications   Medication Sig Start Date End Date Taking? Authorizing Provider  clopidogrel (PLAVIX) 75 MG tablet Take 75 mg by mouth daily.   Yes [provider]  diphenoxylate-atropine (LOMOTIL) 2.5-0.025 MG tablet Take 1 tablet by mouth 4 (four) times daily as needed for diarrhea or loose stools.   Yes [provider]  empagliflozin (JARDIANCE) 25 MG TABS tablet Take 25 mg by mouth daily.   Yes [provider]  lisinopril-hydrochlorothiazide (PRINZIDE,ZESTORETIC) 20-25 MG tablet Take 1 tablet by mouth daily.   Yes [provider]  metoprolol tartrate (LOPRESSOR) 25 MG tablet Take 25 mg by mouth 2 (two) times daily.   Yes [provider]  promethazine (PHENERGAN) 25 MG tablet Take 25 mg by mouth every 6 (six) hours as needed for nausea or vomiting.   Yes [provider]  Liraglutide (VICTOZA [place]) Inject 0.25 Units into the skin.     [provider]                                                     Past Surgical History No past surgical history on file. Family History Family History  Problem Relation Age of Onset  . Diabetes Mother   . Heart failure Father     Social History Social History   Tobacco Use  . Smoking status: Never Smoker  . Smokeless tobacco: Never Used  Substance Use Topics  . Alcohol use: No  . Drug use: No   Allergies Food  Review of Systems Review of Systems All other systems are reviewed and are negative for acute change except as noted in the HPI  Physical Exam Vital Signs  I have reviewed the triage vital signs BP (!) 140/92 (BP Location: Left Arm)   Pulse 99   Temp 98 F (36.7 C) (Oral)   Resp 20   Ht 6\' 1"  (1.854 m)   Wt 116.1 kg (256 lb)   SpO2 100%   BMI 33.78 kg/m   Physical Exam  Constitutional: He is oriented to person, place, and time. He appears well-developed and well-nourished. No distress.  HENT:  Head: Normocephalic and atraumatic.  Nose: Nose normal.  Eyes: Conjunctivae and EOM are normal. Pupils are equal, round, and reactive to light. Right eye exhibits no discharge. Left eye exhibits no discharge. No scleral icterus.  Neck: Normal range of motion. Neck supple.  Cardiovascular: Normal rate and regular rhythm.  Pulmonary/Chest: Effort normal  and breath sounds normal. No stridor. No respiratory distress. He has no rales.  Abdominal: Soft. He exhibits no distension. There is no tenderness.  Musculoskeletal: He exhibits no edema or tenderness.  Neurological: He is alert and oriented to person, place, and time.  Skin: Skin is warm and dry. No rash noted. He is not diaphoretic. No erythema.  Psychiatric: He has a normal mood and affect.  Vitals reviewed.   ED Results and Treatments Labs (all labs ordered are listed, but only abnormal results are displayed) Labs Reviewed  COMPREHENSIVE METABOLIC PANEL - Abnormal; Notable for the following components:      Result Value     Glucose, Bld 166 (*)    BUN 23 (*)    Creatinine, Ser 1.45 (*)    Total Protein 8.6 (*)    GFR calc non Af Amer 57 (*)    All other components within normal limits  CBG MONITORING, ED - Abnormal; Notable for the following components:   Glucose-Capillary 160 (*)    All other components within normal limits  CBC                                                                                                                         EKG  EKG Interpretation  Date/Time:    Ventricular Rate:    PR Interval:    QRS Duration:   QT Interval:    QTC Calculation:   R Axis:     Text Interpretation:        Radiology No results found. Pertinent labs & imaging results that were available during my care of the patient were reviewed by me and considered in my medical decision making (see chart for details).  Medications Ordered in ED Medications - No data to display                                                                                                                                  Procedures Procedures  (including critical care time)  Medical Decision Making / ED Course I have reviewed the nursing notes for this encounter and the patient's prior records (if available in EHR or on provided paperwork).    47 y.o. male presents with vomiting, diarrhea, and fever for 6-7 days. Known sick contacts. Denies possible suspicious food intake.  Adequate oral tolerance. Rest of history as above.  Patient appears well, not in distress, and  with no signs of toxicity or dehydration. Abdomen benign. Rest of the exam as above  Most consistent with viral gastroenteritis.  Given patient's history of diabetes, screening labs were obtained which were grossly reassuring.   Doubt appendicitis, diverticulitis, severe colitis, dysentery.    Able to tolerate oral intake in the ED.  Discussed symptomatic treatment with the patient and they will follow closely with their PCP.   Final Clinical  Impression(s) / ED Diagnoses Final diagnoses:  Nausea vomiting and diarrhea   Disposition: Discharge  Condition: Good  I have discussed the results, Dx and Tx plan with the patient who expressed understanding and agree(s) with the plan. Discharge instructions discussed at great length. The patient was given strict return precautions who verbalized understanding of the instructions. No further questions at time of discharge.    ED Discharge Orders    None       Follow Up: [facility] [address] [place] [address] [phone]  Schedule an appointment as soon as possible for a visit  in 5-7 days, If symptoms do not improve or  worsen      This chart was dictated using voice recognition software.  Despite best efforts to proofread,  errors can occur which can change the documentation meaning.   Nira Conn, MD 07/02/17 [phone]

## 2017-07-02 NOTE — ED Triage Notes (Signed)
N/V/D for over one week.  Children were sick at home with same.

## 2017-09-19 ENCOUNTER — Encounter (HOSPITAL_COMMUNITY): Payer: Self-pay | Admitting: Family Medicine

## 2017-09-19 ENCOUNTER — Ambulatory Visit (HOSPITAL_COMMUNITY)
Admission: EM | Admit: 2017-09-19 | Discharge: 2017-09-19 | Disposition: A | Discharge status: Home / Self Care | Payer: Federal, State, Local not specified - PPO | Attending: Family Medicine | Admitting: Family Medicine

## 2017-09-19 DIAGNOSIS — G5603 Carpal tunnel syndrome, bilateral upper limbs: Secondary | ICD-10-CM

## 2017-09-19 MED ORDER — MELOXICAM 15 MG PO TABS: 15.0000 mg | ORAL_TABLET | Freq: Every day | ORAL | 0 refills | Status: DC

## 2017-09-19 NOTE — ED Provider Notes (Signed)
[facility]    CSN: [id number] Arrival date & time: 09/19/17  1001     History   Chief Complaint Chief Complaint  Patient presents with  . Hand Pain    HPI Javier Suarez is a 47 y.o. male.   Patient has had some tingling in his hands right greater than left for the past several weeks but only yesterday started to experience pain.  It wakes him up sometimes at night.  He works for the [employer] as a Scientist, research (medical).  HPI  Past Medical History:  Diagnosis Date  . Diabetes mellitus without complication (HCC)   . Gallstones     There are no active problems to display for this patient.   History reviewed. No pertinent surgical history.     Home Medications    Prior to Admission medications   Medication Sig Start Date End Date Taking? Authorizing Provider  clopidogrel (PLAVIX) 75 MG tablet Take 75 mg by mouth daily.    [provider]  diphenoxylate-atropine (LOMOTIL) 2.5-0.025 MG tablet Take 1 tablet by mouth 4 (four) times daily as needed for diarrhea or loose stools.    [provider]  empagliflozin (JARDIANCE) 25 MG TABS tablet Take 25 mg by mouth daily.    [provider]  Liraglutide (VICTOZA [place]) Inject 0.25 Units into the skin.     [provider]  lisinopril-hydrochlorothiazide (PRINZIDE,ZESTORETIC) 20-25 MG tablet Take 1 tablet by mouth daily.    [provider]  promethazine (PHENERGAN) 25 MG tablet Take 25 mg by mouth every 6 (six) hours as needed for nausea or vomiting.    [provider]    Family History Family History  Problem Relation Age of Onset  . Diabetes Mother   . Heart failure Father     Social History Social History   Tobacco Use  . Smoking status: Never Smoker  . Smokeless tobacco: Never Used  Substance Use Topics  . Alcohol use: No  . Drug use: No     Allergies   Food   Review of Systems Review of Systems  Constitutional: Negative.   HENT: Negative.     Eyes: Negative.   Respiratory: Negative.   Cardiovascular: Negative.   Neurological:       Has numbness and tingling and both hands     Physical Exam Triage Vital Signs ED Triage Vitals  Enc Vitals Group     BP 09/19/17 1016 (!) 138/102     Pulse Rate 09/19/17 1016 98     Resp 09/19/17 1016 18     Temp 09/19/17 1016 98.5 F (36.9 C)     Temp src --      SpO2 09/19/17 1016 96 %     Weight --      Height --      Head Circumference --      Peak Flow --      Pain Score 09/19/17 1013 3     Pain Loc --      Pain Edu? --      Excl. in GC? --    No data found.  Updated Vital Signs BP (!) 138/102   Pulse 98   Temp 98.5 F (36.9 C)   Resp 18   SpO2 96%   Visual Acuity Right Eye Distance:   Left Eye Distance:   Bilateral Distance:    Right Eye Near:   Left Eye Near:    Bilateral Near:     Physical  Exam  Constitutional: He appears well-developed and well-nourished.  Musculoskeletal:  Has positive Tinel's test bilaterally. Positive Phalen's test bilaterally.  There is no thenar wasting.     UC Treatments / Results  Labs (all labs ordered are listed, but only abnormal results are displayed) Labs Reviewed - No data to display  EKG None  Radiology No results found.  Procedures Procedures (including critical care time)  Medications Ordered in UC Medications - No data to display  Initial Impression / Assessment and Plan / UC Course  I have reviewed the triage vital signs and the nursing notes.  Pertinent labs & imaging results that were available during my care of the patient were reviewed by me and considered in my medical decision making (see chart for details).     Carpal tunnel syndrome.  Also consider neuropathy secondary to diabetes which she has had for 20 years but based on exam it is more likely carpal tunnel.  Have recommended wrist splints as initial treatment and will also provide Rx for meloxicam for 2 weeks. He has been instructed to  [facility] [place]. Final Clinical Impressions(s) / UC Diagnoses   Final diagnoses:  None   Discharge Instructions   None    ED Prescriptions    None     Controlled Substance Prescriptions [place] Controlled Substance Registry consulted? No   Frederica KusterMiller, Amous M, MD 09/19/17 1031

## 2017-09-19 NOTE — ED Triage Notes (Signed)
Pt with hx of diabetes here for bilateral hand pain, numbness and tingling x 3 days. He reports also some stiffening. He is not taking any meds.

## 2017-10-16 ENCOUNTER — Encounter (HOSPITAL_COMMUNITY): Payer: Self-pay | Admitting: Emergency Medicine

## 2017-10-16 ENCOUNTER — Other Ambulatory Visit: Payer: Self-pay

## 2017-10-16 ENCOUNTER — Ambulatory Visit (HOSPITAL_COMMUNITY)
Admission: EM | Admit: 2017-10-16 | Discharge: 2017-10-16 | Disposition: A | Discharge status: Home / Self Care | Payer: Federal, State, Local not specified - PPO | Attending: Family Medicine | Admitting: Family Medicine

## 2017-10-16 DIAGNOSIS — M7711 Lateral epicondylitis, right elbow: Secondary | ICD-10-CM | POA: Diagnosis not present

## 2017-10-16 MED ORDER — NAPROXEN 500 MG PO TABS: 500.0000 mg | ORAL_TABLET | Freq: Two times a day (BID) | ORAL | 0 refills | Status: DC

## 2017-10-16 NOTE — Discharge Instructions (Signed)
Patient [facility] or drug store Continue conservative management of rest, ice, and gentle stretches Take naproxen as needed for pain relief (may cause abdominal discomfort, ulcers, and GI bleeds avoid taking with other NSAIDs) Follow up with PCP if symptoms persist Return or go to the ER if you have any new or worsening symptoms (fever, chills, chest pain, abdominal pain, changes in bowel or bladder habits, pain radiating into lower legs, etc...)

## 2017-10-16 NOTE — ED Provider Notes (Signed)
[facility]   [id number] 10/16/17 Arrival Time: 1243  SUBJECTIVE: History from: patient. Javier Suarez is a 47 y.o. male complains of right elbow pain that began a week ago.  Denies a precipitating event or specific injury, but reports repetitive movements at work at the [employer].  Localizes the pain to the outside of elbow.  Describes the pain as intermittent and achy in character.  Has tried OTC medications without relief.  Symptoms are made worse with elbow ROM.  Denies similar symptoms in the past.  Denies fever, chills, erythema, ecchymosis, effusion, weakness, numbness and tingling.      ROS: As per HPI.  Past Medical History:  Diagnosis Date  . Diabetes mellitus without complication (HCC)   . Gallstones    History reviewed. No pertinent surgical history. Allergies  Allergen Reactions  . Food     Coconut. Swelling    No current facility-administered medications on file prior to encounter.    Current Outpatient Medications on File Prior to Encounter  Medication Sig Dispense Refill  . insulin NPH-regular Human (NOVOLIN 70/30) (70-30) 100 UNIT/ML injection Inject into the skin.    Marland Kitchen. clopidogrel (PLAVIX) 75 MG tablet Take 75 mg by mouth daily.    . diphenoxylate-atropine (LOMOTIL) 2.5-0.025 MG tablet Take 1 tablet by mouth 4 (four) times daily as needed for diarrhea or loose stools.    . empagliflozin (JARDIANCE) 25 MG TABS tablet Take 25 mg by mouth daily.    . Liraglutide (VICTOZA [place]) Inject 0.25 Units into the skin.     Marland Kitchen. lisinopril-hydrochlorothiazide (PRINZIDE,ZESTORETIC) 20-25 MG tablet Take 1 tablet by mouth daily.    . meloxicam (MOBIC) 15 MG tablet Take 1 tablet (15 mg total) by mouth daily. 15 tablet 0  . promethazine (PHENERGAN) 25 MG tablet Take 25 mg by mouth every 6 (six) hours as needed for nausea or vomiting.     Social History   Socioeconomic History  . Marital status: Single    Spouse name: Not on file  . Number of children: Not on file  .  Years of education: Not on file  . Highest education level: Not on file  Occupational History  . Not on file  Social Needs  . Financial resource strain: Not on file  . Food insecurity:    Worry: Not on file    Inability: Not on file  . Transportation needs:    Medical: Not on file    Non-medical: Not on file  Tobacco Use  . Smoking status: Never Smoker  . Smokeless tobacco: Never Used  Substance and Sexual Activity  . Alcohol use: No  . Drug use: No  . Sexual activity: Not on file  Lifestyle  . Physical activity:    Days per week: Not on file    Minutes per session: Not on file  . Stress: Not on file  Relationships  . Social connections:    Talks on phone: Not on file    Gets together: Not on file    Attends religious service: Not on file    Active member of club or organization: Not on file    Attends meetings of clubs or organizations: Not on file    Relationship status: Not on file  . Intimate partner violence:    Fear of current or ex partner: Not on file    Emotionally abused: Not on file    Physically abused: Not on file    Forced sexual activity: Not on file  Other Topics Concern  . Not on file  Social History Narrative  . Not on file   Family History  Problem Relation Age of Onset  . Diabetes Mother   . Heart failure Father     OBJECTIVE:  Vitals:   10/16/17 1355  BP: (!) 154/98  Pulse: 97  Resp: 16  Temp: 98.1 F (36.7 C)  TempSrc: Oral  SpO2: 95%    General appearance: AOx3; in no acute distress.  Head: NCAT Lungs: CTA bilaterally Heart: RRR.  Clear S1 and S2 without murmur, gallops, or rubs.  Radial pulses 2+ bilaterally. Musculoskeletal: Right elbow Inspection: Skin warm, dry, clear and intact without obvious erythema, effusion, or ecchymosis.  Palpation: Tender to palpation about the lateral epicondyle ROM: FROM active and passive Strength: 5/5 shld abduction, 5/5 shld adduction, 5/5 elbow flexion, 5/5 elbow extension, 5/5 grip  strength Skin: warm and dry Neurologic: Ambulates without difficulty; Sensation intact about the upper/ lower extremities Psychological: alert and cooperative; normal mood and affect  ASSESSMENT & PLAN:  1. Lateral epicondylitis of right elbow     @NIMG @  Meds ordered this encounter  Medications  . naproxen (NAPROSYN) 500 MG tablet    Sig: Take 1 tablet (500 mg total) by mouth 2 (two) times daily.    Dispense:  30 tablet    Refill:  0    Order Specific Question:   Supervising Provider    Answer:   Isa Rankin [[id number]]   Patient will obtain tennis elbow strap at the [facility] or drug store Continue conservative management of rest, ice, and gentle stretches Take naproxen as needed for pain relief (may cause abdominal discomfort, ulcers, and GI bleeds avoid taking with other NSAIDs) Follow up with PCP if symptoms persist Return or go to the ER if you have any new or worsening symptoms (fever, chills, chest pain, abdominal pain, changes in bowel or bladder habits, pain radiating into lower legs, etc...)   Reviewed expectations re: course of current medical issues. Questions answered. Outlined signs and symptoms indicating need for more acute intervention. Patient verbalized understanding. After Visit Summary given.    Rennis Harding, PA-C 10/16/17 1424

## 2017-10-16 NOTE — ED Triage Notes (Addendum)
Right elbow pain for a week.  No known injury.  Patient does perform repetitive movements at work.  Patient wears a brace for carpal tunnel in right wrist.   Requesting hard copy scripts

## 2018-01-26 ENCOUNTER — Encounter (HOSPITAL_COMMUNITY): Payer: Self-pay | Admitting: Emergency Medicine

## 2018-01-26 ENCOUNTER — Ambulatory Visit (HOSPITAL_COMMUNITY)
Admission: EM | Admit: 2018-01-26 | Discharge: 2018-01-26 | Disposition: A | Discharge status: Home / Self Care | Payer: Federal, State, Local not specified - PPO | Attending: Family Medicine | Admitting: Family Medicine

## 2018-01-26 DIAGNOSIS — S0501XA Injury of conjunctiva and corneal abrasion without foreign body, right eye, initial encounter: Secondary | ICD-10-CM

## 2018-01-26 MED ORDER — POLYETHYL GLYCOL-PROPYL GLYCOL 0.4-0.3 % OP GEL: 1.0000 "application " | Freq: Every evening | OPHTHALMIC | 0 refills | Status: DC | PRN

## 2018-01-26 MED ORDER — TETRACAINE HCL 0.5 % OP SOLN
OPHTHALMIC | Status: AC
  Filled 2018-01-26: qty 4

## 2018-01-26 MED ORDER — FLUORESCEIN SODIUM 1 MG OP STRP
ORAL_STRIP | OPHTHALMIC | Status: AC
  Filled 2018-01-26: qty 1

## 2018-01-26 MED ORDER — OFLOXACIN 0.3 % OP SOLN: OPHTHALMIC | 0 refills | Status: DC

## 2018-01-26 NOTE — Discharge Instructions (Signed)
Start ofloxacin eyedrops as directed.  Artificial tear gel as needed.  Wait 10-15 minutes between drops, always use artificial tear gel last, as it prevents drops from penetrating through.  As discussed, you can put both in the fridge to help soothe the eye.  Please follow-up with ophthalmology tomorrow for further evaluation and management needed.  Monitor for any worsening of symptoms, changes in vision, sensitivity to light, eye swelling, painful eye movement, follow up with ophthalmology for further evaluation.

## 2018-01-26 NOTE — ED Triage Notes (Signed)
Pt states he went outside and something flew into his eye, feels like something is stuck in there. Pt has difficulty opening his R eye.

## 2018-01-26 NOTE — ED Provider Notes (Signed)
[facility]    CSN: [id number] Arrival date & time: 01/26/18  1606     History   Chief Complaint Chief Complaint  Patient presents with  . Eye Problem    HPI Javier Suarez is a 47 y.o. male.   47 year old male comes in for evaluation if right eye pain and blurry vision. He states he feels as if something flew into his eye while walking into the house. He rinsed eye out with tap water without improvement and came in for evaluation. He denies photophobia. States still with foreign body sensation, and has diffuse blurry vision. Denies contact lens use, does wear reading glasses.      Past Medical History:  Diagnosis Date  . Diabetes mellitus without complication (HCC)   . Gallstones     Patient Active Problem List   Diagnosis Date Noted  . Bilateral carpal tunnel syndrome 09/19/2017    History reviewed. No pertinent surgical history.     Home Medications    Prior to Admission medications   Medication Sig Start Date End Date Taking? Authorizing Provider  clopidogrel (PLAVIX) 75 MG tablet Take 75 mg by mouth daily.    [provider]  diphenoxylate-atropine (LOMOTIL) 2.5-0.025 MG tablet Take 1 tablet by mouth 4 (four) times daily as needed for diarrhea or loose stools.    [provider]  empagliflozin (JARDIANCE) 25 MG TABS tablet Take 25 mg by mouth daily.    [provider]  insulin NPH-regular Suarez (NOVOLIN 70/30) (70-30) 100 UNIT/ML injection Inject into the skin.    [provider]  Liraglutide (VICTOZA [place]) Inject 0.25 Units into the skin.     [provider]  lisinopril-hydrochlorothiazide (PRINZIDE,ZESTORETIC) 20-25 MG tablet Take 1 tablet by mouth daily.    [provider]  meloxicam (MOBIC) 15 MG tablet Take 1 tablet (15 mg total) by mouth daily. Patient not taking: Reported on 01/26/2018 09/19/17   Frederica Kuster, MD  naproxen (NAPROSYN) 500 MG tablet Take 1 tablet (500 mg total) by mouth  2 (two) times daily. Patient not taking: Reported on 01/26/2018 10/16/17   Wurst, [place], PA-C  ofloxacin (OCUFLOX) 0.3 % ophthalmic solution 1 drop in right eye every 4 hours for the first 2 days, then 4 times a day for the next 5 days. 01/26/18   Cathie Hoops, Amy V, PA-C  Polyethyl Glycol-Propyl Glycol (SYSTANE) 0.4-0.3 % GEL ophthalmic gel Place 1 application into both eyes at bedtime as needed. 01/26/18   Cathie Hoops, Amy V, PA-C  promethazine (PHENERGAN) 25 MG tablet Take 25 mg by mouth every 6 (six) hours as needed for nausea or vomiting.    [provider]  simvastatin (ZOCOR) 20 MG tablet Take by mouth.    [provider]    Family History Family History  Problem Relation Age of Onset  . Diabetes Mother   . Heart failure Father     Social History Social History   Tobacco Use  . Smoking status: Never Smoker  . Smokeless tobacco: Never Used  Substance Use Topics  . Alcohol use: No  . Drug use: No     Allergies   Food and Metformin and related   Review of Systems Review of Systems  Reason unable to perform ROS: See HPI as above.     Physical Exam Triage Vital Signs ED Triage Vitals [01/26/18 1628]  Enc Vitals Group     BP (!) 149/96     Pulse Rate 86  Resp 18     Temp 98.9 F (37.2 C)     Temp src      SpO2 100 %     Weight      Height      Head Circumference      Peak Flow      Pain Score 8     Pain Loc      Pain Edu?      Excl. in GC?    No data found.  Updated Vital Signs BP (!) 149/96   Pulse 86   Temp 98.9 F (37.2 C)   Resp 18   SpO2 100%   Visual Acuity Right Eye Distance:   Left Eye Distance:   Bilateral Distance:    Right Eye Near: R Near: 20/200 Left Eye Near:  L Near: 20/20 Bilateral Near:  20/20  Physical Exam  Constitutional: He is oriented to person, place, and time. He appears well-developed and well-nourished. No distress.  HENT:  Head: Normocephalic and atraumatic.  Eyes: Pupils are equal, round, and reactive to  light. EOM and lids are normal. Lids are everted and swept, no foreign bodies found. Right conjunctiva is injected. Left conjunctiva is not injected.  No ciliary injection.  See picture below.  Fluorescein stain with square corneal abrasion that does extend to the pupil.  No seidel sign.  Neck: Normal range of motion. Neck supple.  Neurological: He is alert and oriented to person, place, and time.  Skin: Skin is warm and dry.      UC Treatments / Results  Labs (all labs ordered are listed, but only abnormal results are displayed) Labs Reviewed - No data to display  EKG None  Radiology No results found.  Procedures Procedures (including critical care time)  Medications Ordered in UC Medications - No data to display  Initial Impression / Assessment and Plan / UC Course  I have reviewed the triage vital signs and the nursing notes.  Pertinent labs & imaging results that were available during my care of the patient were reviewed by me and considered in my medical decision making (see chart for details).    Start ofloxacin as directed.  Artificial tear gel as needed.  Patient to follow-up with ophthalmology tomorrow for further evaluation and management needed.  Return precautions given.  Patient expresses understanding and agrees to plan.  Final Clinical Impressions(s) / UC Diagnoses   Final diagnoses:  Abrasion of right cornea, initial encounter    ED Prescriptions    Medication Sig Dispense Auth. Provider   ofloxacin (OCUFLOX) 0.3 % ophthalmic solution 1 drop in right eye every 4 hours for the first 2 days, then 4 times a day for the next 5 days. 5 mL Yu, Amy V, PA-C   Polyethyl Glycol-Propyl Glycol (SYSTANE) 0.4-0.3 % GEL ophthalmic gel Place 1 application into both eyes at bedtime as needed. 1 Bottle Threasa Alpha, [place] 01/26/18 1707

## 2018-02-09 ENCOUNTER — Other Ambulatory Visit: Payer: Self-pay

## 2018-02-09 ENCOUNTER — Ambulatory Visit (HOSPITAL_COMMUNITY)
Admission: EM | Admit: 2018-02-09 | Discharge: 2018-02-09 | Disposition: A | Discharge status: Home / Self Care | Payer: Federal, State, Local not specified - PPO | Attending: Family Medicine | Admitting: Family Medicine

## 2018-02-09 ENCOUNTER — Encounter (HOSPITAL_COMMUNITY): Payer: Self-pay | Admitting: *Deleted

## 2018-02-09 DIAGNOSIS — H5789 Other specified disorders of eye and adnexa: Secondary | ICD-10-CM | POA: Diagnosis not present

## 2018-02-09 MED ORDER — POLYETHYL GLYCOL-PROPYL GLYCOL 0.4-0.3 % OP GEL: 1.0000 "application " | Freq: Every day | OPHTHALMIC | 0 refills | Status: DC

## 2018-02-09 MED ORDER — TOBRAMYCIN 0.3 % OP OINT: 1.0000 "application " | TOPICAL_OINTMENT | Freq: Three times a day (TID) | OPHTHALMIC | 0 refills | Status: DC

## 2018-02-09 NOTE — Discharge Instructions (Signed)
Use the antibiotic ointment 3 times a day in the right eye. Do this for 5 days. There is also a second antibiotic for an eye moisturizing gel.  Use this as often as you needed for discomfort.  Use at bedtime. See your eye specialist if not improved in a couple of days

## 2018-02-09 NOTE — ED Triage Notes (Signed)
Seen in [facility] 10/13 for corneal abrasion; was started on ofloxacin eye drops until seen by [facility] - Rx changed to an abx oint.  Pt states used up entire oint tube and was feeling better; told to continue using lubricating eye drops.  Started 2 nights ago with sensation of foreign body in right eye again.  Restarted ofloxacin eye gtts on his own.  Denies any other sxs.

## 2018-02-09 NOTE — ED Provider Notes (Signed)
[facility]    CSN: [id number] Arrival date & time: 02/09/18  1012     History   Chief Complaint Chief Complaint  Patient presents with  . Eye Pain    HPI Javier Suarez is a 47 y.o. male.   HPI  Recurrence if eye pain since yesterday Some photophobia Normal vision Had been cleared by eye doctor after treatment for corneal abrasion No new trauma Tearing but no discharge Is out of his eye ointment Does NOT wear contact lens  Past Medical History:  Diagnosis Date  . Diabetes mellitus without complication (HCC)   . Gallstones     Patient Active Problem List   Diagnosis Date Noted  . Bilateral carpal tunnel syndrome 09/19/2017    History reviewed. No pertinent surgical history.     Home Medications    Prior to Admission medications   Medication Sig Start Date End Date Taking? Authorizing Provider  clopidogrel (PLAVIX) 75 MG tablet Take 75 mg by mouth daily.   Yes [provider]  insulin NPH-regular Human (NOVOLIN 70/30) (70-30) 100 UNIT/ML injection Inject into the skin.   Yes [provider]  lisinopril-hydrochlorothiazide (PRINZIDE,ZESTORETIC) 20-25 MG tablet Take 1 tablet by mouth daily.   Yes [provider]  simvastatin (ZOCOR) 20 MG tablet Take by mouth.   Yes [provider]  Polyethyl Glycol-Propyl Glycol (SYSTANE) 0.4-0.3 % GEL ophthalmic gel Place 1 application into the right eye at bedtime. 02/09/18   Eustace Moore, MD  tobramycin (TOBREX) 0.3 % ophthalmic ointment Place 1 application into the right eye 3 (three) times daily. 02/09/18   Eustace Moore, MD    Family History Family History  Problem Relation Age of Onset  . Diabetes Mother   . Heart failure Father     Social History Social History   Tobacco Use  . Smoking status: Never Smoker  . Smokeless tobacco: Never Used  Substance Use Topics  . Alcohol use: No  . Drug use: No     Allergies   Food and Metformin and  related   Review of Systems Review of Systems  Constitutional: Negative for chills and fever.  HENT: Negative for ear pain and sore throat.   Eyes: Positive for pain, discharge and redness. Negative for visual disturbance.  Respiratory: Negative for cough and shortness of breath.   Cardiovascular: Negative for chest pain and palpitations.  Gastrointestinal: Negative for abdominal pain and vomiting.  Genitourinary: Negative for dysuria and hematuria.  Musculoskeletal: Negative for arthralgias and back pain.  Skin: Negative for color change and rash.  Neurological: Negative for seizures and syncope.  All other systems reviewed and are negative.    Physical Exam Triage Vital Signs ED Triage Vitals  Enc Vitals Group     BP 02/09/18 1041 (!) 141/97     Pulse Rate 02/09/18 1041 78     Resp 02/09/18 1041 16     Temp 02/09/18 1041 98.4 F (36.9 C)     Temp Source 02/09/18 1041 Oral     SpO2 02/09/18 1041 100 %     Weight --      Height --      Head Circumference --      Peak Flow --      Pain Score 02/09/18 1040 7     Pain Loc --      Pain Edu? --      Excl. in GC? --    No data found.  Updated Vital Signs  BP (!) 141/97   Pulse 78   Temp 98.4 F (36.9 C) (Oral)   Resp 16   SpO2 100%   Visual Acuity Right Eye Distance: 20/50 Left Eye Distance: 20/30 -1 Bilateral Distance: 20/30  Right Eye Near:   Left Eye Near:    Bilateral Near:     Physical Exam  Constitutional: He appears well-developed and well-nourished. No distress.  Mild discomfort noted  HENT:  Head: Normocephalic and atraumatic.  Right Ear: External ear normal.  Left Ear: External ear normal.  Mouth/Throat: Oropharynx is clear and moist.  Eyes: Pupils are equal, round, and reactive to light. EOM and lids are normal. Lids are everted and swept, no foreign bodies found. Right eye exhibits no discharge. Left eye exhibits no discharge. Right conjunctiva is injected. Left conjunctiva is not injected. Right  eye exhibits normal extraocular motion. Left eye exhibits normal extraocular motion. Right pupil is round. Left pupil is round.  no FB seen.  NO corneal abrasion with fluorescein staining.  Mild injection and tearing  Neck: Normal range of motion.  Cardiovascular: Normal rate.  Pulmonary/Chest: Effort normal. No respiratory distress.  Abdominal: Soft. He exhibits no distension.  Musculoskeletal: Normal range of motion. He exhibits no edema.  Neurological: He is alert.  Skin: Skin is warm and dry.     UC Treatments / Results  Labs (all labs ordered are listed, but only abnormal results are displayed) Labs Reviewed - No data to display  EKG None  Radiology No results found.  Procedures Procedures (including critical care time)  Medications Ordered in UC Medications - No data to display  Initial Impression / Assessment and Plan / UC Course  I have reviewed the triage vital signs and the nursing notes.  Pertinent labs & imaging results that were available during my care of the patient were reviewed by me and considered in my medical decision making (see chart for details).      Final Clinical Impressions(s) / UC Diagnoses   Final diagnoses:  Eye irritation     Discharge Instructions     Use the antibiotic ointment 3 times a day in the right eye. Do this for 5 days. There is also a second antibiotic for an eye moisturizing gel.  Use this as often as you needed for discomfort.  Use at bedtime. See your eye specialist if not improved in a couple of days   ED Prescriptions    Medication Sig Dispense Auth. Provider   tobramycin (TOBREX) 0.3 % ophthalmic ointment Place 1 application into the right eye 3 (three) times daily. 3.5 g Eustace Moore, MD   Polyethyl Glycol-Propyl Glycol (SYSTANE) 0.4-0.3 % GEL ophthalmic gel Place 1 application into the right eye at bedtime. 1 Bottle Eustace Moore, MD     Controlled Substance Prescriptions [place] Controlled Substance  Registry consulted? Not Applicable   Eustace Moore, MD 02/09/18 1213

## 2018-02-21 ENCOUNTER — Ambulatory Visit (HOSPITAL_COMMUNITY)
Admission: EM | Admit: 2018-02-21 | Discharge: 2018-02-21 | Disposition: A | Discharge status: Home / Self Care | Payer: Federal, State, Local not specified - PPO | Attending: Internal Medicine | Admitting: Internal Medicine

## 2018-02-21 ENCOUNTER — Encounter (HOSPITAL_COMMUNITY): Payer: Self-pay

## 2018-02-21 DIAGNOSIS — H5711 Ocular pain, right eye: Secondary | ICD-10-CM | POA: Diagnosis not present

## 2018-02-21 MED ORDER — TETRACAINE HCL 0.5 % OP SOLN
OPHTHALMIC | Status: AC
  Filled 2018-02-21: qty 4

## 2018-02-21 MED ORDER — FLUORESCEIN SODIUM 1 MG OP STRP
ORAL_STRIP | OPHTHALMIC | Status: AC
  Filled 2018-02-21: qty 1

## 2018-02-21 NOTE — Discharge Instructions (Signed)
Please follow up with Dr Laruth Bouchard office at 10am.

## 2018-02-21 NOTE — ED Triage Notes (Signed)
Pt presents with right eye pain and blurred vision from an injury to his eye 2 weeks ago.

## 2018-02-21 NOTE — ED Provider Notes (Signed)
[facility]    CSN: [id number] Arrival date & time: 02/21/18  0801     History   Chief Complaint Chief Complaint  Patient presents with  . Eye Problem    HPI ERICA OSUNA is a 47 y.o. male.   47 year old male with history of diabetes comes in for continued right eye irritation.  He was seen 01/26/2018 with a corneal abrasion, went to [facility], and was followed until corneal abrasion healed.  Was seen 02/09/2018 due to return of right eye irritation.  At that time, fluorescein stain without uptake.  He was given erythromycin ointment, and was told to continue artificial tears.  States since that visit, has continued with blurry vision, though stable from last visit.  He continues with eye pain/irritation with photophobia, better with artificial tear gel's.  States eye pain is stable since last visit as well.  Denies vision loss.  Has crusting in the morning, but thinks this is more due to ointment applied at night.  No obvious new trauma.     Past Medical History:  Diagnosis Date  . Diabetes mellitus without complication (HCC)   . Gallstones     Patient Active Problem List   Diagnosis Date Noted  . Bilateral carpal tunnel syndrome 09/19/2017    History reviewed. No pertinent surgical history.     Home Medications    Prior to Admission medications   Medication Sig Start Date End Date Taking? Authorizing Provider  clopidogrel (PLAVIX) 75 MG tablet Take 75 mg by mouth daily.    [provider]  insulin NPH-regular Human (NOVOLIN 70/30) (70-30) 100 UNIT/ML injection Inject into the skin.    [provider]  lisinopril-hydrochlorothiazide (PRINZIDE,ZESTORETIC) 20-25 MG tablet Take 1 tablet by mouth daily.    [provider]  Polyethyl Glycol-Propyl Glycol (SYSTANE) 0.4-0.3 % GEL ophthalmic gel Place 1 application into the right eye at bedtime. 02/09/18   Eustace Moore, MD  simvastatin (ZOCOR) 20 MG tablet Take by mouth.     [provider]  tobramycin (TOBREX) 0.3 % ophthalmic ointment Place 1 application into the right eye 3 (three) times daily. 02/09/18   Eustace Moore, MD    Family History Family History  Problem Relation Age of Onset  . Diabetes Mother   . Heart failure Father     Social History Social History   Tobacco Use  . Smoking status: Never Smoker  . Smokeless tobacco: Never Used  Substance Use Topics  . Alcohol use: No  . Drug use: No     Allergies   Food and Metformin and related   Review of Systems Review of Systems  Reason unable to perform ROS: See HPI as above.     Physical Exam Triage Vital Signs ED Triage Vitals  Enc Vitals Group     BP 02/21/18 0818 (!) 162/99     Pulse Rate 02/21/18 0818 72     Resp 02/21/18 0818 18     Temp 02/21/18 0818 (!) 97.5 F (36.4 C)     Temp Source 02/21/18 0818 Oral     SpO2 02/21/18 0818 100 %     Weight --      Height --      Head Circumference --      Peak Flow --      Pain Score 02/21/18 0820 8     Pain Loc --      Pain Edu? --      Excl. in GC? --  No data found.  Updated Vital Signs BP (!) 162/99 (BP Location: Left Arm)   Pulse 72   Temp (!) 97.5 F (36.4 C) (Oral)   Resp 18   SpO2 100%   Visual Acuity          11/8               10/27      10/13 Right eye:  20/50 ([place])    20/50 ([place])      20/200 ([place]) Left eye:    20/20 ([place])    20/30-1 ([place])   20/20 ([place])  Physical Exam  Constitutional: He is oriented to person, place, and time. He appears well-developed and well-nourished. No distress.  HENT:  Head: Normocephalic and atraumatic.  Eyes: Pupils are equal, round, and reactive to light. EOM and lids are normal. Lids are everted and swept, no foreign bodies found. Right conjunctiva is injected. Left conjunctiva is not injected.  Pinpoint defect at 3-4 o'clock region of the right eye on iris.  Fluorescein stain with uptake at pinpoint defect seen.  Neck: Normal range of motion. Neck supple.    Neurological: He is alert and oriented to person, place, and time.  Skin: Skin is warm and dry.     UC Treatments / Results  Labs (all labs ordered are listed, but only abnormal results are displayed) Labs Reviewed - No data to display  EKG None  Radiology No results found.  Procedures Procedures (including critical care time)  Medications Ordered in UC Medications - No data to display  Initial Impression / Assessment and Plan / UC Course  I have reviewed the triage vital signs and the nursing notes.  Pertinent labs & imaging results that were available during my care of the patient were reviewed by me and considered in my medical decision making (see chart for details).     Patient is a 47 year old male, recently healed corneal abrasion, for which he sustained 01/26/2018.  At that time was put on ofloxacin, artificial tears, and was referred to ophthalmology for follow-up.  Has been following up with [facility], and discharged after healed corneal abrasion.  Symptoms restarted 02/09/2018, was negative fluorescein stain.  However, next available appointment with [facility] 02/24/2018.  Patient came in today as he is unable to wait till this appointment.          11/8               10/27      10/13 Right eye:  20/50 ([place])    20/50 ([place])      20/200 ()  Pinpoint defect concerning for corneal ulcer.  Will have patient follow-up with ophthalmology today.  Made appointment for patient with [facility], 10 AM.  Patient gave verbal consent to fax notes to [facility].  Final Clinical Impressions(s) / UC Diagnoses   Final diagnoses:  Pain of right eye    ED Prescriptions    None       Belinda Fisher, PA-C 02/21/18 4098

## 2018-05-16 ENCOUNTER — Ambulatory Visit (HOSPITAL_COMMUNITY)
Admission: EM | Admit: 2018-05-16 | Discharge: 2018-05-16 | Disposition: A | Discharge status: Home / Self Care | Payer: Federal, State, Local not specified - PPO | Attending: Internal Medicine | Admitting: Internal Medicine

## 2018-05-16 ENCOUNTER — Encounter (HOSPITAL_COMMUNITY): Payer: Self-pay

## 2018-05-16 DIAGNOSIS — B349 Viral infection, unspecified: Secondary | ICD-10-CM

## 2018-05-16 HISTORY — DX: Essential (primary) hypertension: I10

## 2018-05-16 LAB — POCT RAPID STREP A: Streptococcus, Group A Screen (Direct): NEGATIVE

## 2018-05-16 NOTE — Discharge Instructions (Addendum)
Your rapid strep test was negative This is most likely viral illness Ibuprofen for the body aches and sore throat Mucinex for cough and congestion.  Follow up as needed for continued or worsening symptoms

## 2018-05-16 NOTE — ED Provider Notes (Signed)
[facility]    CSN: [id number] Arrival date & time: 05/16/18  4536     History   Chief Complaint Chief Complaint  Patient presents with  . Influenza    HPI Javier Suarez is a 48 y.o. male.   Is a 48 year old male with past medical history of diabetes, hypertension.  He presents with 4 days of cough, sore throat, nasal congestion, body aches, yellow mucus.  Symptoms have been constant and remain the same.  He has been taking some Mucinex with relief in some of his symptoms. His worst symptom is a sore throat.  He is having pain with swallowing.  Denies any history of strep.  Reports that his kids have been sick with flulike symptoms.  No recent traveling.  ROS per HPI      Past Medical History:  Diagnosis Date  . Diabetes mellitus without complication (HCC)   . Gallstones   . Hypertension     Patient Active Problem List   Diagnosis Date Noted  . Bilateral carpal tunnel syndrome 09/19/2017    Past Surgical History:  Procedure Laterality Date  . CARDIAC SURGERY         Home Medications    Prior to Admission medications   Medication Sig Start Date End Date Taking? Authorizing Provider  clopidogrel (PLAVIX) 75 MG tablet Take 75 mg by mouth daily.    [provider]  insulin NPH-regular Human (NOVOLIN 70/30) (70-30) 100 UNIT/ML injection Inject into the skin.    [provider]  lisinopril-hydrochlorothiazide (PRINZIDE,ZESTORETIC) 20-25 MG tablet Take 1 tablet by mouth daily.    [provider]  Polyethyl Glycol-Propyl Glycol (SYSTANE) 0.4-0.3 % GEL ophthalmic gel Place 1 application into the right eye at bedtime. 02/09/18   Eustace Moore, MD  simvastatin (ZOCOR) 20 MG tablet Take by mouth.    [provider]  tobramycin (TOBREX) 0.3 % ophthalmic ointment Place 1 application into the right eye 3 (three) times daily. 02/09/18   Eustace Moore, MD    Family History Family History  Problem Relation Age of  Onset  . Diabetes Mother   . Heart failure Father     Social History Social History   Tobacco Use  . Smoking status: Never Smoker  . Smokeless tobacco: Never Used  Substance Use Topics  . Alcohol use: No  . Drug use: No     Allergies   Food and Metformin and related   Review of Systems Review of Systems   Physical Exam Triage Vital Signs ED Triage Vitals  Enc Vitals Group     BP 05/16/18 0907 (!) 158/107     Pulse Rate 05/16/18 0907 73     Resp 05/16/18 0910 20     Temp 05/16/18 0907 98 F (36.7 C)     Temp Source 05/16/18 0907 Oral     SpO2 05/16/18 0907 100 %     Weight --      Height --      Head Circumference --      Peak Flow --      Pain Score 05/16/18 0908 7     Pain Loc --      Pain Edu? --      Excl. in GC? --    No data found.  Updated Vital Signs BP (!) 158/107 (BP Location: Right Arm)   Pulse 73   Temp 98 F (36.7 C) (Oral)   Resp 20   SpO2 100%   Visual  Acuity Right Eye Distance:   Left Eye Distance:   Bilateral Distance:    Right Eye Near:   Left Eye Near:    Bilateral Near:     Physical Exam Vitals signs and nursing note reviewed.  Constitutional:      General: He is not in acute distress.    Appearance: He is well-developed. He is not ill-appearing, toxic-appearing or diaphoretic.  HENT:     Head: Normocephalic and atraumatic.     Right Ear: Tympanic membrane and ear canal normal.     Left Ear: Tympanic membrane and ear canal normal.     Nose: Congestion present.     Mouth/Throat:     Mouth: Mucous membranes are moist.     Pharynx: Oropharynx is clear. Uvula midline. Posterior oropharyngeal erythema present. No oropharyngeal exudate.     Tonsils: Swelling: 1+ on the right. 1+ on the left.  Eyes:     General:        Right eye: No discharge.        Left eye: No discharge.     Conjunctiva/sclera: Conjunctivae normal.  Neck:     Musculoskeletal: Neck supple.  Cardiovascular:     Rate and Rhythm: Normal rate and regular  rhythm.     Heart sounds: No murmur.  Pulmonary:     Effort: Pulmonary effort is normal. No respiratory distress.     Breath sounds: Normal breath sounds.  Abdominal:     Palpations: Abdomen is soft.     Tenderness: There is no abdominal tenderness.  Skin:    General: Skin is warm and dry.  Neurological:     Mental Status: He is alert.      UC Treatments / Results  Labs (all labs ordered are listed, but only abnormal results are displayed) Labs Reviewed  CULTURE, GROUP A STREP [facility])    EKG None  Radiology No results found.  Procedures Procedures (including critical care time)  Medications Ordered in UC Medications - No data to display  Initial Impression / Assessment and Plan / UC Course  I have reviewed the triage vital signs and the nursing notes.  Pertinent labs & imaging results that were available during my care of the patient were reviewed by me and considered in my medical decision making (see chart for details).     Rapid strep test negative Most likely viral illness You can continue the mucniex for cough and congestion Ibuprofen as needed for fever and body aches.  Follow up as needed for continued or worsening symptoms  Final Clinical Impressions(s) / UC Diagnoses   Final diagnoses:  Viral illness     Discharge Instructions     Your rapid strep test was negative This is most likely viral illness Ibuprofen for the body aches and sore throat Mucinex for cough and congestion.  Follow up as needed for continued or worsening symptoms     ED Prescriptions    None     Controlled Substance Prescriptions [place] Controlled Substance Registry consulted? Not Applicable   Janace Aris, NP 05/16/18 [phone]

## 2018-05-16 NOTE — ED Triage Notes (Signed)
Pt presents with URI symptoms; sore throat, generalized body aches, cough w/ yellow mucus X 4 days.

## 2018-05-18 LAB — CULTURE, GROUP A STREP (THRC)

## 2019-01-07 ENCOUNTER — Emergency Department (HOSPITAL_BASED_OUTPATIENT_CLINIC_OR_DEPARTMENT_OTHER)
Admission: EM | Admit: 2019-01-07 | Discharge: 2019-01-07 | Disposition: A | Discharge status: Home / Self Care | Payer: No Typology Code available for payment source | Attending: Emergency Medicine | Admitting: Emergency Medicine

## 2019-01-07 ENCOUNTER — Emergency Department (HOSPITAL_BASED_OUTPATIENT_CLINIC_OR_DEPARTMENT_OTHER): Payer: No Typology Code available for payment source

## 2019-01-07 ENCOUNTER — Other Ambulatory Visit: Payer: Self-pay

## 2019-01-07 ENCOUNTER — Encounter (HOSPITAL_BASED_OUTPATIENT_CLINIC_OR_DEPARTMENT_OTHER): Payer: Self-pay | Admitting: Emergency Medicine

## 2019-01-07 DIAGNOSIS — E119 Type 2 diabetes mellitus without complications: Secondary | ICD-10-CM | POA: Insufficient documentation

## 2019-01-07 DIAGNOSIS — Z794 Long term (current) use of insulin: Secondary | ICD-10-CM | POA: Insufficient documentation

## 2019-01-07 DIAGNOSIS — I10 Essential (primary) hypertension: Secondary | ICD-10-CM | POA: Diagnosis not present

## 2019-01-07 DIAGNOSIS — R079 Chest pain, unspecified: Secondary | ICD-10-CM | POA: Diagnosis present

## 2019-01-07 DIAGNOSIS — Z79899 Other long term (current) drug therapy: Secondary | ICD-10-CM | POA: Diagnosis not present

## 2019-01-07 LAB — BASIC METABOLIC PANEL
Anion gap: 10 (ref 5–15)
BUN: 22 mg/dL — ABNORMAL HIGH (ref 6–20)
CO2: 28 mmol/L (ref 22–32)
Calcium: 9.8 mg/dL (ref 8.9–10.3)
Chloride: 103 mmol/L (ref 98–111)
Creatinine, Ser: 1.43 mg/dL — ABNORMAL HIGH (ref 0.61–1.24)
GFR calc Af Amer: >60 mL/min (ref >60)
GFR calc non Af Amer: 59 mL/min — ABNORMAL LOW (ref >60)
Glucose, Bld: 91 mg/dL (ref 70–99)
Potassium: 3.7 mmol/L (ref 3.5–5.1)
Sodium: 141 mmol/L (ref 135–145)

## 2019-01-07 LAB — CBC
HCT: 48.8 % (ref 39.0–52.0)
Hemoglobin: 15.5 g/dL (ref 13.0–17.0)
MCH: 27.5 pg (ref 26.0–34.0)
MCHC: 31.8 g/dL (ref 30.0–36.0)
MCV: 86.5 fL (ref 80.0–100.0)
Platelets: 221 10*3/uL (ref 150–400)
RBC: 5.64 MIL/uL (ref 4.22–5.81)
RDW: 13.1 % (ref 11.5–15.5)
WBC: 5.9 10*3/uL (ref 4.0–10.5)
nRBC: 0 % (ref 0.0–0.2)

## 2019-01-07 LAB — TROPONIN I (HIGH SENSITIVITY)
Troponin I (High Sensitivity): 5 ng/L (ref <18)
Troponin I (High Sensitivity): 4 ng/L (ref <18)

## 2019-01-07 MED ORDER — NITROGLYCERIN 0.4 MG SL SUBL: 0.4000 mg | SUBLINGUAL_TABLET | SUBLINGUAL | 0 refills | Status: DC | PRN

## 2019-01-07 MED ORDER — NITROGLYCERIN 0.4 MG SL SUBL
0.4000 mg | SUBLINGUAL_TABLET | SUBLINGUAL | Status: DC | PRN
  Administered 2019-01-07: 11:00:00 0.4 mg via SUBLINGUAL

## 2019-01-07 MED ORDER — NITROGLYCERIN 0.4 MG SL SUBL
SUBLINGUAL_TABLET | SUBLINGUAL | Status: AC
  Administered 2019-01-07: 11:00:00 0.4 mg via SUBLINGUAL
  Filled 2019-01-07: qty 2

## 2019-01-07 MED ORDER — ASPIRIN 81 MG PO CHEW
CHEWABLE_TABLET | ORAL | Status: AC
  Administered 2019-01-07: 324 mg via ORAL
  Filled 2019-01-07: qty 3

## 2019-01-07 MED ORDER — ASPIRIN 81 MG PO CHEW
324.0000 mg | CHEWABLE_TABLET | Freq: Once | ORAL | Status: AC
  Administered 2019-01-07: 11:00:00 324 mg via ORAL

## 2019-01-07 MED ORDER — NITROGLYCERIN 0.4 MG SL SUBL: 0.4000 mg | SUBLINGUAL_TABLET | SUBLINGUAL | 0 refills | Status: AC | PRN

## 2019-01-07 MED FILL — NITROGLYCERIN 0.4 MG TAB SL: 0.4 | 7 days supply | Qty: 25 | Fill #0

## 2019-01-07 NOTE — ED Notes (Signed)
Tightness to left chest is resolved, numbness to left arm is gone.

## 2019-01-07 NOTE — Discharge Instructions (Addendum)
Pertinent for chest pain radiating to your left arm.  You had blood work EKG and a chest x-ray that did not show any evidence of a heart attack.  We talked about admission to the hospital versus close follow-up with your cardiologist and you chose to follow-up with your cardiologist through the [place].  Please continue your daily aspirin and we are refilling a prescription for nitroglycerin.  Return to the emergency department if any recurrence of your pain or other concerning symptoms.

## 2019-01-07 NOTE — ED Provider Notes (Signed)
[facility] EMERGENCY DEPARTMENT Provider Note   CSN: [id number] Arrival date & time: 01/07/19  1016     History   Chief Complaint Chief Complaint  Patient presents with  . Chest Pain    HPI Javier Suarez is a 48 y.o. male.  HPI: A 48 year old patient with a history of treated diabetes, hypertension and hypercholesterolemia presents for evaluation of chest pain. Initial onset of pain was less than one hour ago. The patient's chest pain is described as heaviness/pressure/tightness, is not worse with exertion and is relieved by nitroglycerin. The patient reports some diaphoresis. The patient's chest pain is middle- or left-sided, is not well-localized, is not sharp and does radiate to the arms/jaw/neck. The patient does not complain of nausea. The patient has no history of stroke, has no history of peripheral artery disease, has not smoked in the past 90 days, has no relevant family history of coronary artery disease (first degree relative at less than age 63) and does not have an elevated BMI (>=30).    He said he was at work at the [employer] and lifting a package when his left hand became weak and numb when he dropped the package.  He began having left-sided chest pain radiating to his left arm that was severe in nature pressure associated with diaphoresis.  He took 1 nitro and the pain went from 10-6.   He is presenting with ongoing pain 6 out of 10  The history is provided by the patient.  Chest Pain Pain location:  Substernal area Pain quality: pressure   Pain radiates to:  L arm Pain severity:  Severe Onset quality:  Sudden Duration:  1 hour Timing:  Constant Progression:  Improving Chronicity:  New Context: lifting   Relieved by:  Nothing Worsened by:  Nothing Ineffective treatments:  Nitroglycerin Associated symptoms: diaphoresis and numbness   Associated symptoms: no abdominal pain, no back pain, no cough, no fever, no headache, no nausea, no palpitations,  no shortness of breath and no vomiting   Risk factors: coronary artery disease, diabetes mellitus, hypertension and male sex     Past Medical History:  Diagnosis Date  . Diabetes mellitus without complication (HCC)   . Gallstones   . Hypertension     Patient Active Problem List   Diagnosis Date Noted  . Bilateral carpal tunnel syndrome 09/19/2017    Past Surgical History:  Procedure Laterality Date  . CARDIAC SURGERY          Home Medications    Prior to Admission medications   Medication Sig Start Date End Date Taking? Authorizing Provider  nitroGLYCERIN (NITROSTAT) 0.4 MG SL tablet Place under the tongue. 02/16/17  Yes [provider]  lisinopril-hydrochlorothiazide (PRINZIDE,ZESTORETIC) 20-25 MG tablet Take 1 tablet by mouth daily.    [provider]  metoprolol tartrate (LOPRESSOR) 50 MG tablet  09/19/18   [provider]  NOVOLOG MIX 70/30 FLEXPEN (70-30) 100 UNIT/ML FlexPen  11/13/18   [provider]  Polyethyl Glycol-Propyl Glycol (SYSTANE) 0.4-0.3 % GEL ophthalmic gel Place 1 application into the right eye at bedtime. 02/09/18   Eustace Moore, MD  simvastatin (ZOCOR) 20 MG tablet Take by mouth.    [provider]  tobramycin (TOBREX) 0.3 % ophthalmic ointment Place 1 application into the right eye 3 (three) times daily. 02/09/18   Eustace Moore, MD    Family History Family History  Problem Relation Age of Onset  . Diabetes Mother   . Heart  failure Father     Social History Social History   Tobacco Use  . Smoking status: Never Smoker  . Smokeless tobacco: Never Used  Substance Use Topics  . Alcohol use: No  . Drug use: No     Allergies   Food, Metformin and related, and Metformin   Review of Systems Review of Systems  Constitutional: Positive for diaphoresis. Negative for fever.  HENT: Negative for sore throat.   Eyes: Negative for visual disturbance.  Respiratory: Negative for cough and  shortness of breath.   Cardiovascular: Positive for chest pain. Negative for palpitations.  Gastrointestinal: Negative for abdominal pain, nausea and vomiting.  Genitourinary: Negative for dysuria.  Musculoskeletal: Negative for back pain.  Skin: Negative for rash.  Neurological: Positive for numbness. Negative for headaches.     Physical Exam Updated Vital Signs BP 115/88   Pulse 71   Temp 98.4 F (36.9 C) (Oral)   Resp 12   Ht 6\' 1"  (1.854 m)   Wt 117.9 kg   SpO2 99%   BMI 34.30 kg/m   Physical Exam Vitals signs and nursing note reviewed.  Constitutional:      Appearance: He is well-developed.  HENT:     Head: Normocephalic and atraumatic.  Eyes:     Conjunctiva/sclera: Conjunctivae normal.  Neck:     Musculoskeletal: Neck supple.  Cardiovascular:     Rate and Rhythm: Normal rate and regular rhythm.     Heart sounds: Normal heart sounds. No murmur.  Pulmonary:     Effort: Pulmonary effort is normal. No respiratory distress.     Breath sounds: Normal breath sounds.  Abdominal:     Palpations: Abdomen is soft.     Tenderness: There is no abdominal tenderness.  Musculoskeletal: Normal range of motion.     Right lower leg: He exhibits no tenderness.     Left lower leg: He exhibits no tenderness.  Skin:    General: Skin is warm and dry.     Capillary Refill: Capillary refill takes less than 2 seconds.  Neurological:     Mental Status: He is alert.     Comments: He has tingling in his left arm mostly in his second third and fourth digits which she says radiates up his arm.  There is no loss of strength.  Cap refill and radial pulse strong.      ED Treatments / Results  Labs (all labs ordered are listed, but only abnormal results are displayed) Labs Reviewed  BASIC METABOLIC PANEL - Abnormal; Notable for the following components:      Result Value   BUN 22 (*)    Creatinine, Ser 1.43 (*)    GFR calc non Af Amer 59 (*)    All other components within normal  limits  CBC  TROPONIN I (HIGH SENSITIVITY)  TROPONIN I (HIGH SENSITIVITY)    EKG EKG Interpretation  Date/Time:  Wednesday January 07 2019 10:26:38 EDT Ventricular Rate:  73 PR Interval:    QRS Duration: 156 QT Interval:  407 QTC Calculation: 449 R Axis:   150 Text Interpretation:  Sinus rhythm RBBB and LBBB similar to prior 1/18 Confirmed by Meridee Score [phone]) on 01/07/2019 10:31:16 AM   Radiology Dg Chest Portable 1 View  Result Date: 01/07/2019 CLINICAL DATA:  Left-sided chest pain. EXAM: PORTABLE CHEST 1 VIEW COMPARISON:  09/07/2016.  04/28/2016. FINDINGS: Mediastinum and hilar structures normal. Heart size normal. Low lung volumes. Mild right base subsegmental atelectasis/infiltrate. Calcified right upper lung pulmonary  nodules consistent with prior granulomas disease. No pleural effusion or pneumothorax. No acute bony abnormality. IMPRESSION: Low lung volumes.  Mild right base atelectasis/infiltrate. Electronically Signed   By: Marcello Moores  Register   On: 01/07/2019 10:58    Procedures Procedures (including critical care time)  Medications Ordered in ED Medications  aspirin chewable tablet 324 mg (has no administration in time range)  nitroGLYCERIN (NITROSTAT) SL tablet 0.4 mg (has no administration in time range)     Initial Impression / Assessment and Plan / ED Course  I have reviewed the triage vital signs and the nursing notes.  Pertinent labs & imaging results that were available during my care of the patient were reviewed by me and considered in my medical decision making (see chart for details).  Clinical Course as of Jan 06 1658  Wed Jan 06, 4866  5449 48 year old male with history of coronary disease stent placed 2 years ago at [facility] here with substernal chest pressure and left arm numbness.  EKG shows right bundle and left posterior fascicular block.  Similar to prior.  Getting aspirin and nitro.  Differential includes ACS, dissection, PE,  musculoskeletal, pneumothorax.   [MB]  1101 Chest x-ray reviewed by me.  No gross infiltrates.  Radiology comments upon some mild right basilar atelectasis versus infiltrate.   [MB]  1607 Reevaluated patient.  He says he had one nitroglycerin tablet from [place] and his pain is now a 0 out of 10.  His initial work-up showing a first troponin of 5.  Creatinine is 143 which is elevated but looks to be about his new baseline.  Hemoglobin normal.   [MB]  1346 Patient had a delta troponin that went from 5-4.  I will check what he wants to do from here.   [MB]  3710 Doing admission to the hospital here for further work-up versus him contacting his cardiologist through the [place].  He said he would rather go through the [place] as that is where all his care is through.  He has no symptoms since he has been here and feels back to baseline.  We will refill his nitroglycerin and gave him clear instructions on return.  He understands to call his cardiologist tomorrow for close follow-up and to return if any worsening symptoms.   [MB]    Clinical Course User Index [MB] Hayden Rasmussen, MD    [facility] Score: 5   Final Clinical Impressions(s) / ED Diagnoses   Final diagnoses:  Nonspecific chest pain    ED Discharge Orders         Ordered    nitroGLYCERIN (NITROSTAT) 0.4 MG SL tablet  Every 5 min PRN,   Status:  Discontinued     01/07/19 1357    nitroGLYCERIN (NITROSTAT) 0.4 MG SL tablet  Every 5 min PRN     01/07/19 1405           Hayden Rasmussen, MD 01/07/19 1700

## 2019-01-07 NOTE — ED Triage Notes (Signed)
Pt c/o left sided chest pain radiating to left arm.  Pt also has numbness to left hand/arm.  Pt states he was at work this am, picked up a package and pain began.  Some diaphoresis.  Pt states he took one nitroglycerin and went from pain 10- to 6.

## 2019-02-19 ENCOUNTER — Other Ambulatory Visit: Payer: Self-pay

## 2019-02-19 DIAGNOSIS — Z20822 Contact with and (suspected) exposure to covid-19: Secondary | ICD-10-CM

## 2019-02-21 LAB — NOVEL CORONAVIRUS, NAA: SARS-CoV-2, NAA: NOT DETECTED

## 2019-03-13 ENCOUNTER — Inpatient Hospital Stay (HOSPITAL_BASED_OUTPATIENT_CLINIC_OR_DEPARTMENT_OTHER)
Admission: EM | Admit: 2019-03-13 | Discharge: 2019-03-16 | DRG: 603 | Disposition: A | Discharge status: Home / Self Care | Payer: Federal, State, Local not specified - PPO | Attending: Family Medicine | Admitting: Family Medicine

## 2019-03-13 ENCOUNTER — Encounter (HOSPITAL_BASED_OUTPATIENT_CLINIC_OR_DEPARTMENT_OTHER): Payer: Self-pay | Admitting: Radiology

## 2019-03-13 ENCOUNTER — Other Ambulatory Visit: Payer: Self-pay

## 2019-03-13 ENCOUNTER — Emergency Department (HOSPITAL_BASED_OUTPATIENT_CLINIC_OR_DEPARTMENT_OTHER): Payer: Federal, State, Local not specified - PPO

## 2019-03-13 DIAGNOSIS — L02211 Cutaneous abscess of abdominal wall: Secondary | ICD-10-CM | POA: Diagnosis present

## 2019-03-13 DIAGNOSIS — E1165 Type 2 diabetes mellitus with hyperglycemia: Secondary | ICD-10-CM | POA: Diagnosis present

## 2019-03-13 DIAGNOSIS — E1122 Type 2 diabetes mellitus with diabetic chronic kidney disease: Secondary | ICD-10-CM

## 2019-03-13 DIAGNOSIS — R739 Hyperglycemia, unspecified: Secondary | ICD-10-CM

## 2019-03-13 DIAGNOSIS — Z20828 Contact with and (suspected) exposure to other viral communicable diseases: Secondary | ICD-10-CM | POA: Diagnosis present

## 2019-03-13 DIAGNOSIS — N1831 Chronic kidney disease, stage 3a: Secondary | ICD-10-CM | POA: Diagnosis present

## 2019-03-13 DIAGNOSIS — M793 Panniculitis, unspecified: Secondary | ICD-10-CM

## 2019-03-13 DIAGNOSIS — I129 Hypertensive chronic kidney disease with stage 1 through stage 4 chronic kidney disease, or unspecified chronic kidney disease: Secondary | ICD-10-CM | POA: Diagnosis present

## 2019-03-13 DIAGNOSIS — Z91018 Allergy to other foods: Secondary | ICD-10-CM | POA: Diagnosis not present

## 2019-03-13 DIAGNOSIS — Z8249 Family history of ischemic heart disease and other diseases of the circulatory system: Secondary | ICD-10-CM | POA: Diagnosis not present

## 2019-03-13 DIAGNOSIS — Z833 Family history of diabetes mellitus: Secondary | ICD-10-CM

## 2019-03-13 DIAGNOSIS — Z794 Long term (current) use of insulin: Secondary | ICD-10-CM

## 2019-03-13 DIAGNOSIS — N183 Chronic kidney disease, stage 3 unspecified: Secondary | ICD-10-CM | POA: Diagnosis present

## 2019-03-13 DIAGNOSIS — L03311 Cellulitis of abdominal wall: Secondary | ICD-10-CM | POA: Diagnosis present

## 2019-03-13 DIAGNOSIS — Z79899 Other long term (current) drug therapy: Secondary | ICD-10-CM | POA: Diagnosis not present

## 2019-03-13 DIAGNOSIS — E119 Type 2 diabetes mellitus without complications: Secondary | ICD-10-CM

## 2019-03-13 DIAGNOSIS — Z888 Allergy status to other drugs, medicaments and biological substances status: Secondary | ICD-10-CM

## 2019-03-13 DIAGNOSIS — E785 Hyperlipidemia, unspecified: Secondary | ICD-10-CM | POA: Diagnosis present

## 2019-03-13 DIAGNOSIS — I10 Essential (primary) hypertension: Secondary | ICD-10-CM | POA: Diagnosis present

## 2019-03-13 DIAGNOSIS — J9811 Atelectasis: Secondary | ICD-10-CM | POA: Diagnosis present

## 2019-03-13 LAB — CBC WITH DIFFERENTIAL/PLATELET
Abs Immature Granulocytes: 0.12 10*3/uL — ABNORMAL HIGH (ref 0.00–0.07)
Basophils Absolute: 0 10*3/uL (ref 0.0–0.1)
Basophils Relative: 0 %
Eosinophils Absolute: 0.1 10*3/uL (ref 0.0–0.5)
Eosinophils Relative: 1 %
HCT: 37.6 % — ABNORMAL LOW (ref 39.0–52.0)
Hemoglobin: 12 g/dL — ABNORMAL LOW (ref 13.0–17.0)
Immature Granulocytes: 1 %
Lymphocytes Relative: 10 %
Lymphs Abs: 1.3 10*3/uL (ref 0.7–4.0)
MCH: 27 pg (ref 26.0–34.0)
MCHC: 31.9 g/dL (ref 30.0–36.0)
MCV: 84.7 fL (ref 80.0–100.0)
Monocytes Absolute: 1 10*3/uL (ref 0.1–1.0)
Monocytes Relative: 7 %
Neutro Abs: 11.2 10*3/uL — ABNORMAL HIGH (ref 1.7–7.7)
Neutrophils Relative %: 81 %
Platelets: 207 10*3/uL (ref 150–400)
RBC: 4.44 MIL/uL (ref 4.22–5.81)
RDW: 12.7 % (ref 11.5–15.5)
WBC: 13.7 10*3/uL — ABNORMAL HIGH (ref 4.0–10.5)
nRBC: 0 % (ref 0.0–0.2)

## 2019-03-13 LAB — URINALYSIS, ROUTINE W REFLEX MICROSCOPIC
Bilirubin Urine: NEGATIVE
Glucose, UA: >=500 mg/dL — AB
Ketones, ur: 15 mg/dL — AB
Leukocytes,Ua: NEGATIVE
Nitrite: NEGATIVE
Protein, ur: 30 mg/dL — AB
Specific Gravity, Urine: 1.01 (ref 1.005–1.030)
pH: 5.5 (ref 5.0–8.0)

## 2019-03-13 LAB — COMPREHENSIVE METABOLIC PANEL
ALT: 20 U/L (ref 0–44)
AST: 19 U/L (ref 15–41)
Albumin: 3 g/dL — ABNORMAL LOW (ref 3.5–5.0)
Alkaline Phosphatase: 84 U/L (ref 38–126)
Anion gap: 8 (ref 5–15)
BUN: 25 mg/dL — ABNORMAL HIGH (ref 6–20)
CO2: 22 mmol/L (ref 22–32)
Calcium: 9.1 mg/dL (ref 8.9–10.3)
Chloride: 102 mmol/L (ref 98–111)
Creatinine, Ser: 1.55 mg/dL — ABNORMAL HIGH (ref 0.61–1.24)
GFR calc Af Amer: >60 mL/min (ref >60)
GFR calc non Af Amer: 53 mL/min — ABNORMAL LOW (ref >60)
Glucose, Bld: 578 mg/dL (ref 70–99)
Potassium: 4 mmol/L (ref 3.5–5.1)
Sodium: 132 mmol/L — ABNORMAL LOW (ref 135–145)
Total Bilirubin: 0.6 mg/dL (ref 0.3–1.2)
Total Protein: 6.9 g/dL (ref 6.5–8.1)

## 2019-03-13 LAB — URINALYSIS, MICROSCOPIC (REFLEX): WBC, UA: NONE SEEN WBC/hpf (ref 0–5)

## 2019-03-13 LAB — SARS CORONAVIRUS 2 (TAT 6-24 HRS): SARS Coronavirus 2: NEGATIVE

## 2019-03-13 LAB — CBG MONITORING, ED
Glucose-Capillary: 522 mg/dL (ref 70–99)
Glucose-Capillary: 488 mg/dL — ABNORMAL HIGH (ref 70–99)

## 2019-03-13 LAB — LACTIC ACID, PLASMA: Lactic Acid, Venous: 1.2 mmol/L (ref 0.5–1.9)

## 2019-03-13 LAB — GLUCOSE, CAPILLARY: Glucose-Capillary: 321 mg/dL — ABNORMAL HIGH (ref 70–99)

## 2019-03-13 MED ORDER — VANCOMYCIN HCL 10 G IV SOLR
2000.0000 mg | Freq: Once | INTRAVENOUS | Status: AC
  Administered 2019-03-14: 2000 mg via INTRAVENOUS
  Filled 2019-03-13: qty 2000

## 2019-03-13 MED ORDER — CLINDAMYCIN PHOSPHATE 600 MG/50ML IV SOLN
600.0000 mg | Freq: Once | INTRAVENOUS | Status: AC
  Administered 2019-03-13: 600 mg via INTRAVENOUS
  Filled 2019-03-13: qty 50

## 2019-03-13 MED ORDER — SODIUM CHLORIDE 0.9 % IV BOLUS
1000.0000 mL | Freq: Once | INTRAVENOUS | Status: AC
  Administered 2019-03-13: 1000 mL via INTRAVENOUS

## 2019-03-13 MED ORDER — HYDROMORPHONE HCL 1 MG/ML IJ SOLN
0.5000 mg | Freq: Once | INTRAMUSCULAR | Status: AC
  Administered 2019-03-13: 0.5 mg via INTRAVENOUS
  Filled 2019-03-13: qty 1

## 2019-03-13 MED ORDER — LIDOCAINE-EPINEPHRINE (PF) 2 %-1:200000 IJ SOLN
20.0000 mL | Freq: Once | INTRAMUSCULAR | Status: AC
  Administered 2019-03-13: 10 mL
  Filled 2019-03-13: qty 20

## 2019-03-13 MED ORDER — INSULIN REGULAR HUMAN 100 UNIT/ML IJ SOLN
10.0000 [IU] | Freq: Once | INTRAMUSCULAR | Status: AC
  Administered 2019-03-13: 10 [IU] via SUBCUTANEOUS
  Filled 2019-03-13: qty 1

## 2019-03-13 MED ORDER — SODIUM CHLORIDE 0.9 % IV SOLN
2.0000 g | Freq: Three times a day (TID) | INTRAVENOUS | Status: DC
  Administered 2019-03-14 – 2019-03-16 (×8): 2 g via INTRAVENOUS
  Filled 2019-03-13 (×9): qty 2

## 2019-03-13 MED ORDER — SODIUM CHLORIDE 0.9 % IV SOLN
INTRAVENOUS | Status: DC | PRN
  Administered 2019-03-13: 250 mL via INTRAVENOUS

## 2019-03-13 MED ORDER — IOHEXOL 300 MG/ML  SOLN
100.0000 mL | Freq: Once | INTRAMUSCULAR | Status: AC | PRN
  Administered 2019-03-13: 80 mL via INTRAVENOUS

## 2019-03-13 MED ORDER — HYDROMORPHONE HCL 1 MG/ML IJ SOLN
0.5000 mg | Freq: Once | INTRAMUSCULAR | Status: AC
  Administered 2019-03-13: 14:00:00 0.5 mg via INTRAVENOUS
  Filled 2019-03-13: qty 1

## 2019-03-13 MED ORDER — ONDANSETRON HCL 4 MG/2ML IJ SOLN
4.0000 mg | Freq: Once | INTRAMUSCULAR | Status: AC
  Administered 2019-03-13: 4 mg via INTRAVENOUS
  Filled 2019-03-13: qty 2

## 2019-03-13 NOTE — H&P (Signed)
History and Physical   [name] ZOX:[id number] DOB: 1970/10/03 DOA: 03/13/2019  Referring MD/NP/PA: [facility]  [facility]   Outpatient Specialists: None  Patient coming from: Home via [facility]  Chief Complaint: Lower abdominal pain and swelling   HPI: Javier Suarez is a 48 y.o. male with medical history significant of noninsulin dependent diabetes, hypertension, history of gallstones, hyperlipidemia who presented with 3 days of worsening lower abdominal pain and swelling.  Area was tender and started draining pus.  It is directly in the suprapubic area below his abdominal falls.  Patient was having pain as high as 7 out of 10 in the area.  He felt some chills but denied any fever subjectively.  He was seen in the ER and found to have lower abdominal wall to suprapubic area abscess.  I&D performed in the ER and patient is being admitted for work-up.  Patient also noted to have hyperglycemia.  Wound care initiated.  Cultures obtained and sent to the lab.  Patient being admitted for further treatment..  ED Course: Temperature onset 98.8 blood pressure 155/96 pulse 110 respirate of 20 oxygen sat 92% room air.  Sodium 132 blood sugar 578 with creatinine 1.55.  White count 13.7 hemoglobin 12.1 platelets 207.  COVID-19 screening is negative.  Urinalysis essentially negative.  T abdomen pelvis showed mild bilateral posterior subsegmental atelectasis.  There is a 3.2 x 2 cm subcutaneous abscess along the inferior portion of the pannus.  This was incised and patient sent over for treatment  Review of Systems: As per HPI otherwise 10 point review of systems negative.    Past Medical History:  Diagnosis Date   Diabetes mellitus without complication (HCC)    Gallstones    Hypertension     Past Surgical History:  Procedure Laterality Date   CARDIAC SURGERY       reports that he has never smoked. He has never used smokeless tobacco. He reports  that he does not drink alcohol or use drugs.  Allergies  Allergen Reactions   Food     Coconut. Swelling    Metformin And Related Diarrhea   Metformin Diarrhea    Family History  Problem Relation Age of Onset   Diabetes Mother    Heart failure Father      Prior to Admission medications   Medication Sig Start Date End Date Taking? Authorizing Provider  atorvastatin (LIPITOR) 20 MG tablet Take 20 mg by mouth daily. 02/28/19   [provider]  lisinopril-hydrochlorothiazide (PRINZIDE,ZESTORETIC) 20-25 MG tablet Take 1 tablet by mouth daily.    [provider]  metoprolol tartrate (LOPRESSOR) 50 MG tablet  09/19/18   [provider]  nitroGLYCERIN (NITROSTAT) 0.4 MG SL tablet Place 1 tablet (0.4 mg total) under the tongue every 5 (five) minutes as needed for chest pain (up to 3 doses). 01/07/19   Terrilee Files, MD  NOVOLOG MIX 70/30 FLEXPEN (70-30) 100 UNIT/ML FlexPen  11/13/18   [provider]  Polyethyl Glycol-Propyl Glycol (SYSTANE) 0.4-0.3 % GEL ophthalmic gel Place 1 application into the right eye at bedtime. 02/09/18   Eustace Moore, MD    Physical Exam: Vitals:   03/13/19 1600 03/13/19 1700 03/13/19 1730 03/13/19 2018  BP: 116/81 (!) 136/97 125/86 (!) 140/99  Pulse: 86 88 85 85  Resp: Temp:    98.1 F (36.7 C)  TempSrc:    Oral  SpO2: 95% 94%  92% 96%  Weight:      Height:          Constitutional: NAD, calm, comfortable Vitals:   03/13/19 1600 03/13/19 1700 03/13/19 1730 03/13/19 2018  BP: 116/81 (!) 136/97 125/86 (!) 140/99  Pulse: 86 88 85 85  Resp: 12 17 19 15   Temp:    98.1 F (36.7 C)  TempSrc:    Oral  SpO2: 95% 94% 92% 96%  Weight:      Height:       Eyes: PERRL, lids and conjunctivae normal ENMT: Mucous membranes are moist. Posterior pharynx clear of any exudate or lesions.Normal dentition.  Neck: normal, supple, no masses, no thyromegaly Respiratory: clear to auscultation bilaterally, no  wheezing, no crackles. Normal respiratory effort. No accessory muscle use.  Cardiovascular: Tachycardic, no murmurs / rubs / gallops. No extremity edema. 2+ pedal pulses. No carotid bruits.  Abdomen: Lower abdomen with draining pus, area just below the pannus in the suprapubic region, no tenderness, no masses palpated. No hepatosplenomegaly. Bowel sounds positive.  Musculoskeletal: no clubbing / cyanosis. No joint deformity upper and lower extremities. Good ROM, no contractures. Normal muscle tone.  Skin: no rashes, lesions, ulcers. No induration Neurologic: CN 2-12 grossly intact. Sensation intact, DTR normal. Strength 5/5 in all 4.  Psychiatric: Normal judgment and insight. Alert and oriented x 3. Normal mood.     Labs on Admission: I have personally reviewed following labs and imaging studies  CBC: Recent Labs  Lab 03/13/19 1348  WBC 13.7*  NEUTROABS 11.2*  HGB 12.0*  HCT 37.6*  MCV 84.7  PLT 207   Basic Metabolic Panel: Recent Labs  Lab 03/13/19 1348  NA 132*  K 4.0  CL 102  CO2 22  GLUCOSE 578*  BUN 25*  CREATININE 1.55*  CALCIUM 9.1   GFR: Estimated Creatinine Clearance: 82.5 mL/min (A) (by C-G formula based on SCr of 1.55 mg/dL (H)). Liver Function Tests: Recent Labs  Lab 03/13/19 1348  AST 19  ALT 20  ALKPHOS 84  BILITOT 0.6  PROT 6.9  ALBUMIN 3.0*   No results for input(s): LIPASE, AMYLASE in the last 168 hours. No results for input(s): AMMONIA in the last 168 hours. Coagulation Profile: No results for input(s): INR, PROTIME in the last 168 hours. Cardiac Enzymes: No results for input(s): CKTOTAL, CKMB, CKMBINDEX, TROPONINI in the last 168 hours. BNP (last 3 results) No results for input(s): PROBNP in the last 8760 hours. HbA1C: No results for input(s): HGBA1C in the last 72 hours. CBG: Recent Labs  Lab 03/13/19 1345 03/13/19 1559 03/13/19 2222  GLUCAP 522* 488* 321*   Lipid Profile: No results for input(s): CHOL, HDL, LDLCALC, TRIG,  CHOLHDL, LDLDIRECT in the last 72 hours. Thyroid Function Tests: No results for input(s): TSH, T4TOTAL, FREET4, T3FREE, THYROIDAB in the last 72 hours. Anemia Panel: No results for input(s): VITAMINB12, FOLATE, FERRITIN, TIBC, IRON, RETICCTPCT in the last 72 hours. Urine analysis:    Component Value Date/Time   COLORURINE YELLOW 03/13/2019 1348   APPEARANCEUR CLEAR 03/13/2019 1348   LABSPEC 1.010 03/13/2019 1348   PHURINE 5.5 03/13/2019 1348   GLUCOSEU >=500 (A) 03/13/2019 1348   HGBUR SMALL (A) 03/13/2019 1348   BILIRUBINUR NEGATIVE 03/13/2019 1348   KETONESUR 15 (A) 03/13/2019 1348   PROTEINUR 30 (A) 03/13/2019 1348   NITRITE NEGATIVE 03/13/2019 1348   LEUKOCYTESUR NEGATIVE 03/13/2019 1348   Sepsis Labs: @LABRCNTIP (procalcitonin:4,lacticidven:4) ) Recent Results (from the past 240 hour(s))  SARS CORONAVIRUS 2 (TAT 6-24 HRS)  Nasopharyngeal Nasopharyngeal Swab     Status: None   Collection Time: 03/13/19  4:21 PM   Specimen: Nasopharyngeal Swab  Result Value Ref Range Status   SARS Coronavirus 2 NEGATIVE NEGATIVE Final    Comment: (NOTE) SARS-CoV-2 target nucleic acids are NOT DETECTED. The SARS-CoV-2 RNA is generally detectable in upper and lower respiratory specimens during the acute phase of infection. Negative results do not preclude SARS-CoV-2 infection, do not rule out co-infections with other pathogens, and should not be used as the sole basis for treatment or other patient management decisions. Negative results must be combined with clinical observations, patient history, and epidemiological information. The expected result is Negative. Fact Sheet for Patients: HairSlick.no Fact Sheet for Healthcare Providers: quierodirigir.com This test is not yet approved or cleared by the [place] FDA and  has been authorized for detection and/or diagnosis of SARS-CoV-2 by FDA under an Emergency Use Authorization (EUA).  This EUA will remain  in effect (meaning this test can be used) for the duration of the COVID-19 declaration under Section 56 4(b)(1) of the Act, 21 U.S.C. section 360bbb-3(b)(1), unless the authorization is terminated or revoked sooner. Performed at [facility], [address] [place], [place] [address]      Radiological Exams on Admission: Ct Abdomen Pelvis W Contrast  Result Date: 03/13/2019 CLINICAL DATA:  Lower abdominal pain. EXAM: CT ABDOMEN AND PELVIS WITH CONTRAST TECHNIQUE: Multidetector CT imaging of the abdomen and pelvis was performed using the standard protocol following bolus administration of intravenous contrast. CONTRAST:  73mL OMNIPAQUE IOHEXOL 300 MG/ML  SOLN COMPARISON:  April 28, 2016. FINDINGS: Lower chest: Mild bilateral posterior basilar subsegmental atelectasis is noted. Hepatobiliary: No focal liver abnormality is seen. No gallstones, gallbladder wall thickening, or biliary dilatation. Pancreas: Unremarkable. No pancreatic ductal dilatation or surrounding inflammatory changes. Spleen: Normal in size without focal abnormality. Adrenals/Urinary Tract: Adrenal glands are unremarkable. Kidneys are normal, without renal calculi, focal lesion, or hydronephrosis. Bladder is unremarkable. Stomach/Bowel: Stomach is within normal limits. Appendix appears normal. No evidence of bowel wall thickening, distention, or inflammatory changes. Vascular/Lymphatic: No significant vascular findings are present. No enlarged abdominal or pelvic lymph nodes. Reproductive: Prostate is unremarkable. Other: No hernia is noted. No ascites is noted. Focal subcutaneous inflammatory changes are seen along the inferior portion of the pannus. Possible 3.2 x 2.0 cm subcutaneous abscess may be present. Musculoskeletal: No acute or significant osseous findings. IMPRESSION: 1. Mild bilateral posterior basilar subsegmental atelectasis. 2. Focal subcutaneous inflammatory changes are seen along the  inferior portion of the pannus. Possible 3.2 x 2.0 cm subcutaneous abscess may be present. 3. No other abnormality seen in the abdomen or pelvis. Electronically Signed   By: Lupita Raider M.D.   On: 03/13/2019 15:14      Assessment/Plan Principal Problem:   Abdominal wall abscess Active Problems:   Diabetes (HCC)   Benign essential HTN     #1 lower abdominal wall abscess: Most likely secondary to patient's diabetic status.  Gram-positive cocci likely but as a diabetic may be mixed flora.  I will initiate IV vancomycin and cefepime.  Blood and wound culture currently pending.  Pain management and supportive care.  Monitor closely.  If no significant improvement will get surgical consult for more extensive I&D.  #2 diabetes: Sliding scale insulin initiated.  Continue IV fluids.  Check A1c  #3 hypertension: Continue home regimen.  Blood pressure so far controlled.   DVT prophylaxis: Lovenox Code Status: Full code Family Communication: No  family at bedside Disposition Plan: Home Consults called: None Admission status: Inpatient  Severity of Illness: The appropriate patient status for this patient is INPATIENT. Inpatient status is judged to be reasonable and necessary in order to provide the required intensity of service to ensure the patient's safety. The patient's presenting symptoms, physical exam findings, and initial radiographic and laboratory data in the context of their chronic comorbidities is felt to place them at high risk for further clinical deterioration. Furthermore, it is not anticipated that the patient will be medically stable for discharge from the hospital within 2 midnights of admission. The following factors support the patient status of inpatient.   " The patient's presenting symptoms include lower abdominal wall swelling. " The worrisome physical exam findings include draining area. " The initial radiographic and laboratory data are worrisome because of CT  showing abscess prior to I&D. " The chronic co-morbidities include diabetes and hypertension.   * I certify that at the point of admission it is my clinical judgment that the patient will require inpatient hospital care spanning beyond 2 midnights from the point of admission due to high intensity of service, high risk for further deterioration and high frequency of surveillance required.Barbette Merino MD [facility] Pager [phone]  If 7PM-7AM, please contact night-coverage [email or web address] Password [facility]  03/13/2019, 11:26 PM

## 2019-03-13 NOTE — ED Notes (Signed)
ED Provider at bedside, Dr. Floyd 

## 2019-03-13 NOTE — Progress Notes (Signed)
Pharmacy Antibiotic Note  Javier Suarez is a 48 y.o. male with abdominal wound with drainage and pain admitted on 03/13/2019 with cellulitis.  Pharmacy has been consulted for cellulitis dosing.  Plan: Cefepime 2 Gm IV q8h Vancomycin 2 Gm x1 then 1 Gm IV q12h for est AUC = 473 Use scr = 1.37, Vd = 0.5 Goal AUC = 400-550 F/u scr/cultures/levels Adjusted Lovenox to 60 mg daily in pt with BMI = 36  Height: 6\' 1"  (185.4 cm) Weight: 275 lb 11.2 oz (125.1 kg) IBW/kg (Calculated) : 79.9  Temp (24hrs), Avg:98.5 F (36.9 C), Min:98.1 F (36.7 C), Max:98.8 F (37.1 C)  Recent Labs  Lab 03/13/19 1348  WBC 13.7*  CREATININE 1.55*  LATICACIDVEN 1.2    Estimated Creatinine Clearance: 82.5 mL/min (A) (by C-G formula based on SCr of 1.55 mg/dL (H)).    Allergies  Allergen Reactions  . Food     Coconut. Swelling   . Metformin And Related Diarrhea  . Metformin Diarrhea    Antimicrobials this admission: 11/27 clindamycin >> x1 ED 11/28 cefepime >>  11/28 vancomycin >>  Dose adjustments this admission:   Microbiology results:  BCx:   UCx:    Sputum:    MRSA PCR:   Thank you for allowing pharmacy to be a part of this patient's care.  Dorrene German 03/13/2019 11:42 PM

## 2019-03-13 NOTE — ED Notes (Signed)
Patient transported to CT 

## 2019-03-13 NOTE — ED Notes (Signed)
ED Provider at bedside. Discussing test results and plan of care.

## 2019-03-13 NOTE — ED Provider Notes (Signed)
[facility] EMERGENCY DEPARTMENT Provider Note   CSN: [id number] Arrival date & time: 03/13/19  1248     History   Chief Complaint Chief Complaint  Patient presents with  . Abscess    HPI Javier Suarez is a 48 y.o. male.     With history of diabetes, high blood pressure -- presents to the emergency department with 3 days of worsening lower abdominal pain and infection in the soft tissues in his lower abdomen.  Patient has had purulent drainage from the area.  It is in the skin fold of the lower abdomen.  No fevers.  No nausea or vomiting.  Patient has not been checking his blood sugar and states frequent urination.  He was having difficulty sleeping due to the pain.  No bowel changes.  No treatments prior to arrival.  No trauma to the area.  Initially, patient thought that he was wearing his belt too tight.  Onset of symptoms gradual.  Course is worsening.  Nothing makes symptoms better.     Past Medical History:  Diagnosis Date  . Diabetes mellitus without complication ([place])   . Gallstones   . Hypertension     Patient Active Problem List   Diagnosis Date Noted  . Bilateral carpal tunnel syndrome 09/19/2017    Past Surgical History:  Procedure Laterality Date  . CARDIAC SURGERY          Home Medications    Prior to Admission medications   Medication Sig Start Date End Date Taking? Authorizing Provider  lisinopril-hydrochlorothiazide (PRINZIDE,ZESTORETIC) 20-25 MG tablet Take 1 tablet by mouth daily.    [provider]  metoprolol tartrate (LOPRESSOR) 50 MG tablet  09/19/18   [provider]  nitroGLYCERIN (NITROSTAT) 0.4 MG SL tablet Place 1 tablet (0.4 mg total) under the tongue every 5 (five) minutes as needed for chest pain (up to 3 doses). 01/07/19   Hayden Rasmussen, MD  NOVOLOG MIX 70/30 FLEXPEN (70-30) 100 UNIT/ML FlexPen  11/13/18   [provider]  Polyethyl Glycol-Propyl Glycol (SYSTANE) 0.4-0.3 % GEL ophthalmic gel  Place 1 application into the right eye at bedtime. 02/09/18   Raylene Everts, MD  simvastatin (ZOCOR) 20 MG tablet Take by mouth.    [provider]    Family History Family History  Problem Relation Age of Onset  . Diabetes Mother   . Heart failure Father     Social History Social History   Tobacco Use  . Smoking status: Never Smoker  . Smokeless tobacco: Never Used  Substance Use Topics  . Alcohol use: No  . Drug use: No     Allergies   Food, Metformin and related, and Metformin   Review of Systems Review of Systems  Constitutional: Negative for fever.  HENT: Negative for rhinorrhea and sore throat.   Eyes: Negative for redness.  Respiratory: Negative for cough.   Cardiovascular: Negative for chest pain.  Gastrointestinal: Positive for abdominal pain. Negative for diarrhea, nausea and vomiting.  Genitourinary: Negative for dysuria.  Musculoskeletal: Negative for myalgias.  Skin: Positive for color change. Negative for rash.  Neurological: Negative for headaches.     Physical Exam Updated Vital Signs BP (!) 155/96 (BP Location: Right Arm)   Pulse (!) 110   Temp 98.8 F (37.1 C) (Oral)   Resp 20   Ht 6\' 1"  (1.854 m)   Wt 125.1 kg   SpO2 98%   BMI 36.37 kg/m   Physical Exam Vitals signs and  nursing note reviewed.  Constitutional:      Appearance: He is well-developed.  HENT:     Head: Normocephalic and atraumatic.  Eyes:     General:        Right eye: No discharge.        Left eye: No discharge.     Conjunctiva/sclera: Conjunctivae normal.  Neck:     Musculoskeletal: Normal range of motion and neck supple.  Cardiovascular:     Rate and Rhythm: Regular rhythm. Tachycardia present.     Heart sounds: Normal heart sounds.  Pulmonary:     Effort: Pulmonary effort is normal.     Breath sounds: Normal breath sounds.  Abdominal:     Palpations: Abdomen is soft.     Tenderness: There is abdominal tenderness.     Comments: Patient with a  large area of cellulitis in the crease along the lower abdomen and abdominal wall.  There is purulent discharge noted over this area within the crease.  Patient has several irregular areas of induration but no 1 discrete abscess.  Skin:    General: Skin is warm and dry.  Neurological:     Mental Status: He is alert.      ED Treatments / Results  Labs (all labs ordered are listed, but only abnormal results are displayed) Labs Reviewed  COMPREHENSIVE METABOLIC PANEL - Abnormal; Notable for the following components:      Result Value   Sodium 132 (*)    Glucose, Bld 578 (*)    BUN 25 (*)    Creatinine, Ser 1.55 (*)    Albumin 3.0 (*)    GFR calc non Af Amer 53 (*)    All other components within normal limits  CBC WITH DIFFERENTIAL/PLATELET - Abnormal; Notable for the following components:   WBC 13.7 (*)    Hemoglobin 12.0 (*)    HCT 37.6 (*)    Neutro Abs 11.2 (*)    Abs Immature Granulocytes 0.12 (*)    All other components within normal limits  URINALYSIS, ROUTINE W REFLEX MICROSCOPIC - Abnormal; Notable for the following components:   Glucose, UA >=500 (*)    Hgb urine dipstick SMALL (*)    Ketones, ur 15 (*)    Protein, ur 30 (*)    All other components within normal limits  URINALYSIS, MICROSCOPIC (REFLEX) - Abnormal; Notable for the following components:   Bacteria, UA RARE (*)    All other components within normal limits  CBG MONITORING, ED - Abnormal; Notable for the following components:   Glucose-Capillary 522 (*)    All other components within normal limits  CBG MONITORING, ED - Abnormal; Notable for the following components:   Glucose-Capillary 488 (*)    All other components within normal limits  URINE CULTURE  AEROBIC CULTURE (SUPERFICIAL SPECIMEN)  SARS CORONAVIRUS 2 (TAT 6-24 HRS)  LACTIC ACID, PLASMA    EKG None  Radiology Ct Abdomen Pelvis W Contrast  Result Date: 03/13/2019 CLINICAL DATA:  Lower abdominal pain. EXAM: CT ABDOMEN AND PELVIS WITH  CONTRAST TECHNIQUE: Multidetector CT imaging of the abdomen and pelvis was performed using the standard protocol following bolus administration of intravenous contrast. CONTRAST:  36mL OMNIPAQUE IOHEXOL 300 MG/ML  SOLN COMPARISON:  April 28, 2016. FINDINGS: Lower chest: Mild bilateral posterior basilar subsegmental atelectasis is noted. Hepatobiliary: No focal liver abnormality is seen. No gallstones, gallbladder wall thickening, or biliary dilatation. Pancreas: Unremarkable. No pancreatic ductal dilatation or surrounding inflammatory changes. Spleen: Normal in size without  focal abnormality. Adrenals/Urinary Tract: Adrenal glands are unremarkable. Kidneys are normal, without renal calculi, focal lesion, or hydronephrosis. Bladder is unremarkable. Stomach/Bowel: Stomach is within normal limits. Appendix appears normal. No evidence of bowel wall thickening, distention, or inflammatory changes. Vascular/Lymphatic: No significant vascular findings are present. No enlarged abdominal or pelvic lymph nodes. Reproductive: Prostate is unremarkable. Other: No hernia is noted. No ascites is noted. Focal subcutaneous inflammatory changes are seen along the inferior portion of the pannus. Possible 3.2 x 2.0 cm subcutaneous abscess may be present. Musculoskeletal: No acute or significant osseous findings. IMPRESSION: 1. Mild bilateral posterior basilar subsegmental atelectasis. 2. Focal subcutaneous inflammatory changes are seen along the inferior portion of the pannus. Possible 3.2 x 2.0 cm subcutaneous abscess may be present. 3. No other abnormality seen in the abdomen or pelvis. Electronically Signed   By: Lupita Raider M.D.   On: 03/13/2019 15:14    Procedures .Marland KitchenIncision and Drainage  Date/Time: 03/13/2019 6:06 PM Performed by: Renne Crigler, PA-C Authorized by: Renne Crigler, PA-C   Consent:    Consent obtained:  Verbal   Consent given by:  Patient   Risks discussed:  Pain, bleeding, damage to other  organs and infection   Alternatives discussed:  No treatment Location:    Type:  Abscess   Size:  3cm   Location:  Trunk   Trunk location:  Abdomen Pre-procedure details:    Skin preparation:  Betadine Anesthesia (see MAR for exact dosages):    Anesthesia method:  Local infiltration   Local anesthetic:  Lidocaine 2% WITH epi Procedure type:    Complexity:  Simple Procedure details:    Incision types:  Stab incision   Incision depth:  Dermal   Scalpel blade:  11   Wound management:  Probed and deloculated   Drainage:  Purulent   Drainage amount:  Copious   Wound treatment:  Drain placed   Packing materials:  1/4 in iodoform gauze Post-procedure details:    Patient tolerance of procedure:  Tolerated well, no immediate complications Comments:     Patient with moderate venous oozing from incision, pressure and gauze applied.    (including critical care time)  Medications Ordered in ED Medications  0.9 %  sodium chloride infusion ( Intravenous Stopped 03/13/19 1657)  sodium chloride 0.9 % bolus 1,000 mL ( Intravenous Stopped 03/13/19 1503)  HYDROmorphone (DILAUDID) injection 0.5 mg (0.5 mg Intravenous Given 03/13/19 1346)  ondansetron (ZOFRAN) injection 4 mg (4 mg Intravenous Given 03/13/19 1345)  insulin regular (NOVOLIN R) 100 units/mL injection 10 Units (10 Units Subcutaneous Given 03/13/19 1432)  sodium chloride 0.9 % bolus 1,000 mL ( Intravenous Stopped 03/13/19 1609)  iohexol (OMNIPAQUE) 300 MG/ML solution 100 mL (80 mLs Intravenous Contrast Given 03/13/19 1448)  clindamycin (CLEOCIN) IVPB 600 mg (0 mg Intravenous Stopped 03/13/19 1657)  lidocaine-EPINEPHrine (XYLOCAINE W/EPI) 2 %-1:200000 (PF) injection 20 mL (10 mLs Infiltration Given 03/13/19 1629)  HYDROmorphone (DILAUDID) injection 0.5 mg (0.5 mg Intravenous Given 03/13/19 1658)     Initial Impression / Assessment and Plan / ED Course  I have reviewed the triage vital signs and the nursing notes.  Pertinent labs  & imaging results that were available during my care of the patient were reviewed by me and considered in my medical decision making (see chart for details).        Patient seen and examined.  Patient with history of diabetes.  He has an abdominal wall cellulitis and probable abscess.  It is difficult to discern  the extension of this infection.  Patient is afebrile but does have significant pain and tachycardia.  Will check blood sugar and lab work.  Will obtain imaging to discern the extent of the infectious process.  May need attempt at I&D depending upon result of CT scan.  Fluids and pain medication ordered.  Vital signs reviewed and are as follows: BP (!) 155/96 (BP Location: Right Arm)   Pulse (!) 110   Temp 98.8 F (37.1 C) (Oral)   Resp 20   Ht 6\' 1"  (1.854 m)   Wt 125.1 kg   SpO2 98%   BMI 36.37 kg/m   CT reviewed.  I&D performed as above without significant complication.   6:07 PM recommended admission given uncontrolled blood sugars and extensive cellulitis.  Discussed patient with Dr. Gonzella Lexhungel who accepts patient for admission.  Final Clinical Impressions(s) / ED Diagnoses   Final diagnoses:  Panniculitis  Abscess of abdominal wall   Patient with extensive abdominal wall cellulitis and abscess complicated by uncontrolled diabetes and hyperglycemia.  No DKA.  ED Discharge Orders    None       Renne CriglerGeiple, Sanad Fearnow, PA-C 03/13/19 1808    Melene PlanFloyd, Dan, DO 03/14/19 [phone]

## 2019-03-13 NOTE — Progress Notes (Addendum)
   Referred by Joan Flores PA at [facility]  48 year old obese male with history of hypertension, IDDM presented to [place] with 3 days of worsening lower abdominal pain and purulent drainage from the area.  Had significant cellulitis over the area. Vitals in the ED stable except for mild tachycardia.  A CT of the abdomen pelvis done showed a focal subcutaneous changes along the inferior portion of the pannus with 3 x 2 cm subcutaneous abscess.  Patient had leucocytosis and significant hyperglycemia in the 500s without anion gap.  I&D performed by ED and wound sent for culture.  Patient given 2 L IV normal saline bolus, 10 units regular insulin, IV Dilaudid and IV clindamycin.  Hospitalist consulted for management of the abscess with IV antibiotic and blood glucose control.  Admit to inpatient Med[facility]urg at [facility].   COVID 19 pending ( no resp symptoms, checked 3 weeks back and negative)

## 2019-03-13 NOTE — ED Triage Notes (Signed)
Pt c/o pain in fold of abdomen, area with swelling and acute pain, states started 3 days ago and pain has gotten worse with more swollen

## 2019-03-14 DIAGNOSIS — N183 Chronic kidney disease, stage 3 unspecified: Secondary | ICD-10-CM | POA: Diagnosis present

## 2019-03-14 DIAGNOSIS — E1165 Type 2 diabetes mellitus with hyperglycemia: Secondary | ICD-10-CM

## 2019-03-14 DIAGNOSIS — Z794 Long term (current) use of insulin: Secondary | ICD-10-CM

## 2019-03-14 DIAGNOSIS — I10 Essential (primary) hypertension: Secondary | ICD-10-CM

## 2019-03-14 DIAGNOSIS — E1122 Type 2 diabetes mellitus with diabetic chronic kidney disease: Secondary | ICD-10-CM | POA: Diagnosis present

## 2019-03-14 DIAGNOSIS — M793 Panniculitis, unspecified: Secondary | ICD-10-CM

## 2019-03-14 LAB — COMPREHENSIVE METABOLIC PANEL
ALT: 18 U/L (ref 0–44)
AST: 13 U/L — ABNORMAL LOW (ref 15–41)
Albumin: 2.7 g/dL — ABNORMAL LOW (ref 3.5–5.0)
Alkaline Phosphatase: 73 U/L (ref 38–126)
Anion gap: 10 (ref 5–15)
BUN: 22 mg/dL — ABNORMAL HIGH (ref 6–20)
CO2: 22 mmol/L (ref 22–32)
Calcium: 8.4 mg/dL — ABNORMAL LOW (ref 8.9–10.3)
Chloride: 107 mmol/L (ref 98–111)
Creatinine, Ser: 1.34 mg/dL — ABNORMAL HIGH (ref 0.61–1.24)
GFR calc Af Amer: >60 mL/min (ref >60)
GFR calc non Af Amer: >60 mL/min (ref >60)
Glucose, Bld: 300 mg/dL — ABNORMAL HIGH (ref 70–99)
Potassium: 3.6 mmol/L (ref 3.5–5.1)
Sodium: 139 mmol/L (ref 135–145)
Total Bilirubin: 0.4 mg/dL (ref 0.3–1.2)
Total Protein: 6.3 g/dL — ABNORMAL LOW (ref 6.5–8.1)

## 2019-03-14 LAB — CBC
HCT: 40.6 % (ref 39.0–52.0)
HCT: 37 % — ABNORMAL LOW (ref 39.0–52.0)
Hemoglobin: 12.7 g/dL — ABNORMAL LOW (ref 13.0–17.0)
Hemoglobin: 11.6 g/dL — ABNORMAL LOW (ref 13.0–17.0)
MCH: 27.3 pg (ref 26.0–34.0)
MCH: 27.7 pg (ref 26.0–34.0)
MCHC: 31.3 g/dL (ref 30.0–36.0)
MCHC: 31.4 g/dL (ref 30.0–36.0)
MCV: 87.1 fL (ref 80.0–100.0)
MCV: 88.3 fL (ref 80.0–100.0)
Platelets: 222 10*3/uL (ref 150–400)
Platelets: 204 10*3/uL (ref 150–400)
RBC: 4.66 MIL/uL (ref 4.22–5.81)
RBC: 4.19 MIL/uL — ABNORMAL LOW (ref 4.22–5.81)
RDW: 13 % (ref 11.5–15.5)
RDW: 13 % (ref 11.5–15.5)
WBC: 11.8 10*3/uL — ABNORMAL HIGH (ref 4.0–10.5)
WBC: 12.4 10*3/uL — ABNORMAL HIGH (ref 4.0–10.5)
nRBC: 0 % (ref 0.0–0.2)
nRBC: 0 % (ref 0.0–0.2)

## 2019-03-14 LAB — CREATININE, SERUM
Creatinine, Ser: 1.37 mg/dL — ABNORMAL HIGH (ref 0.61–1.24)
GFR calc Af Amer: >60 mL/min (ref >60)
GFR calc non Af Amer: >60 mL/min (ref >60)

## 2019-03-14 LAB — GLUCOSE, CAPILLARY
Glucose-Capillary: 307 mg/dL — ABNORMAL HIGH (ref 70–99)
Glucose-Capillary: 245 mg/dL — ABNORMAL HIGH (ref 70–99)
Glucose-Capillary: 278 mg/dL — ABNORMAL HIGH (ref 70–99)
Glucose-Capillary: 267 mg/dL — ABNORMAL HIGH (ref 70–99)
Glucose-Capillary: 270 mg/dL — ABNORMAL HIGH (ref 70–99)

## 2019-03-14 LAB — HEMOGLOBIN A1C
Hgb A1c MFr Bld: 14.7 % — ABNORMAL HIGH (ref 4.8–5.6)
Mean Plasma Glucose: 375.19 mg/dL

## 2019-03-14 LAB — URINE CULTURE: Culture: <10000 — AB

## 2019-03-14 LAB — HIV ANTIBODY (ROUTINE TESTING W REFLEX): HIV Screen 4th Generation wRfx: NONREACTIVE

## 2019-03-14 MED ORDER — ONDANSETRON HCL 4 MG PO TABS: 4.0000 mg | ORAL_TABLET | Freq: Four times a day (QID) | ORAL | Status: DC | PRN

## 2019-03-14 MED ORDER — ACETAMINOPHEN 325 MG PO TABS
650.0000 mg | ORAL_TABLET | Freq: Four times a day (QID) | ORAL | Status: DC | PRN
  Administered 2019-03-16: 650 mg via ORAL
  Filled 2019-03-14: qty 2

## 2019-03-14 MED ORDER — SODIUM CHLORIDE 0.9 % IV SOLN
INTRAVENOUS | Status: DC
  Administered 2019-03-14 – 2019-03-16 (×4): via INTRAVENOUS

## 2019-03-14 MED ORDER — LISINOPRIL 20 MG PO TABS
20.0000 mg | ORAL_TABLET | Freq: Every day | ORAL | Status: DC
  Administered 2019-03-14 – 2019-03-16 (×3): 20 mg via ORAL
  Filled 2019-03-14 (×3): qty 1

## 2019-03-14 MED ORDER — INSULIN GLARGINE 100 UNIT/ML ~~LOC~~ SOLN
80.0000 [IU] | Freq: Every day | SUBCUTANEOUS | Status: DC
  Administered 2019-03-14: 80 [IU] via SUBCUTANEOUS
  Filled 2019-03-14: qty 0.8

## 2019-03-14 MED ORDER — ATORVASTATIN CALCIUM 20 MG PO TABS
20.0000 mg | ORAL_TABLET | Freq: Every day | ORAL | Status: DC
  Administered 2019-03-14 – 2019-03-16 (×3): 20 mg via ORAL
  Filled 2019-03-14 (×3): qty 1

## 2019-03-14 MED ORDER — LISINOPRIL-HYDROCHLOROTHIAZIDE 20-25 MG PO TABS: 1.0000 | ORAL_TABLET | Freq: Every day | ORAL | Status: DC

## 2019-03-14 MED ORDER — MORPHINE SULFATE (PF) 2 MG/ML IV SOLN: 2.0000 mg | INTRAVENOUS | Status: DC | PRN

## 2019-03-14 MED ORDER — INSULIN ASPART 100 UNIT/ML ~~LOC~~ SOLN
12.0000 [IU] | Freq: Three times a day (TID) | SUBCUTANEOUS | Status: DC
  Administered 2019-03-14 (×2): 12 [IU] via SUBCUTANEOUS

## 2019-03-14 MED ORDER — INSULIN ASPART 100 UNIT/ML ~~LOC~~ SOLN
0.0000 [IU] | Freq: Every day | SUBCUTANEOUS | Status: DC
  Administered 2019-03-14: 4 [IU] via SUBCUTANEOUS
  Administered 2019-03-14: 3 [IU] via SUBCUTANEOUS
  Administered 2019-03-15: 2 [IU] via SUBCUTANEOUS

## 2019-03-14 MED ORDER — INSULIN ASPART 100 UNIT/ML ~~LOC~~ SOLN
0.0000 [IU] | Freq: Three times a day (TID) | SUBCUTANEOUS | Status: DC
  Administered 2019-03-14: 8 [IU] via SUBCUTANEOUS
  Administered 2019-03-14: 5 [IU] via SUBCUTANEOUS
  Administered 2019-03-14 – 2019-03-15 (×2): 8 [IU] via SUBCUTANEOUS
  Administered 2019-03-15: 4 [IU] via SUBCUTANEOUS
  Administered 2019-03-15: 5 [IU] via SUBCUTANEOUS
  Administered 2019-03-16 (×2): 3 [IU] via SUBCUTANEOUS

## 2019-03-14 MED ORDER — ASPIRIN EC 81 MG PO TBEC
81.0000 mg | DELAYED_RELEASE_TABLET | Freq: Every day | ORAL | Status: DC
  Administered 2019-03-14 – 2019-03-16 (×3): 81 mg via ORAL
  Filled 2019-03-14 (×3): qty 1

## 2019-03-14 MED ORDER — HYDRALAZINE HCL 20 MG/ML IJ SOLN: 10.0000 mg | Freq: Four times a day (QID) | INTRAMUSCULAR | Status: DC | PRN

## 2019-03-14 MED ORDER — INSULIN GLARGINE 100 UNIT/ML ~~LOC~~ SOLN
90.0000 [IU] | Freq: Every day | SUBCUTANEOUS | Status: DC
  Filled 2019-03-14: qty 0.9

## 2019-03-14 MED ORDER — ENOXAPARIN SODIUM 60 MG/0.6ML ~~LOC~~ SOLN
60.0000 mg | Freq: Every day | SUBCUTANEOUS | Status: DC
  Administered 2019-03-14: 60 mg via SUBCUTANEOUS
  Filled 2019-03-14: qty 0.6

## 2019-03-14 MED ORDER — INSULIN ASPART 100 UNIT/ML ~~LOC~~ SOLN
15.0000 [IU] | Freq: Three times a day (TID) | SUBCUTANEOUS | Status: DC
  Administered 2019-03-14 – 2019-03-16 (×6): 15 [IU] via SUBCUTANEOUS

## 2019-03-14 MED ORDER — METOPROLOL TARTRATE 50 MG PO TABS
50.0000 mg | ORAL_TABLET | Freq: Two times a day (BID) | ORAL | Status: DC
  Administered 2019-03-14 – 2019-03-16 (×5): 50 mg via ORAL
  Filled 2019-03-14 (×5): qty 1

## 2019-03-14 MED ORDER — ACETAMINOPHEN 650 MG RE SUPP: 650.0000 mg | Freq: Four times a day (QID) | RECTAL | Status: DC | PRN

## 2019-03-14 MED ORDER — INSULIN ASPART 100 UNIT/ML ~~LOC~~ SOLN: 0.0000 [IU] | Freq: Three times a day (TID) | SUBCUTANEOUS | Status: DC

## 2019-03-14 MED ORDER — NITROGLYCERIN 0.4 MG SL SUBL: 0.4000 mg | SUBLINGUAL_TABLET | SUBLINGUAL | Status: DC | PRN

## 2019-03-14 MED ORDER — HYDROCHLOROTHIAZIDE 25 MG PO TABS
25.0000 mg | ORAL_TABLET | Freq: Every day | ORAL | Status: DC
  Administered 2019-03-14 – 2019-03-16 (×3): 25 mg via ORAL
  Filled 2019-03-14 (×3): qty 1

## 2019-03-14 MED ORDER — ONDANSETRON HCL 4 MG/2ML IJ SOLN: 4.0000 mg | Freq: Four times a day (QID) | INTRAMUSCULAR | Status: DC | PRN

## 2019-03-14 MED ORDER — VANCOMYCIN HCL IN DEXTROSE 1-5 GM/200ML-% IV SOLN
1000.0000 mg | Freq: Two times a day (BID) | INTRAVENOUS | Status: DC
  Administered 2019-03-14 – 2019-03-16 (×4): 1000 mg via INTRAVENOUS
  Filled 2019-03-14 (×4): qty 200

## 2019-03-14 NOTE — Progress Notes (Addendum)
PROGRESS NOTE                                                                                                                                                                                                             Patient Demographics:    Javier Suarez, is a 48 y.o. male, DOB - [birth year], TOI:[id number]  Admit date - 03/13/2019   Admitting Physician Eddie North, MD  Outpatient [facility] the [facility]  LOS - 1    Chief Complaint  Patient presents with   Abscess       Brief Narrative 48 year old male with uncontrolled diabetes mellitus on insulin, hypertension, history of gallstones and hyperlipidemia presented to med [facility] with 3 days of worsening lower abdominal pain with swelling, tender and draining pus.  In the ED he was found to have significant cellulitis over the lower abdominal wall with abscess in the suprapubic area.  I&D done and patient given IV clindamycin, IV fluids.  His blood glucose was in the 500s without anion gap. Admitted for IV antibiotic and blood glucose management.   Subjective:   Reports his abdominal pain to be better.  Afebrile.   Assessment  & Plan :    Principal Problem:   Abdominal wall cellulitis with abscess Status post I&D.  Follow wound  culture.  Empiric vancomycin and cefepime.  Will narrow antibiotic based on culture.  Area of cellulitis appears improved on exam this morning.  Active Problems: Diabetes mellitus type 2, uncontrolled with hyperglycemia A1c close to 15. On NPH 70/30, 80 units twice daily.  Follows with an endocrinologist at the [place].  Reports that he was previously on Metformin which she did not tolerate well.  He was also on Lantus but for reasons unclear he was switched to 70/30 and his sugars have not been well controlled. I have placed him on Lantus 90 units daily with premeal aspart of 15 units 3 times a day.   Patient hyperglycemic in the 400s-500s on presentation without anion gap.  Getting aggressive IV hydration.  He wants to establish care with a local endocrinologist. Diabetic coordinator consult.   Uncontrolled hypertension   Continue metoprolol, HCTZ and lisinopril.  Add as needed hydralazine.  Chronic kidney disease stage IIIa Baseline creatinine around 1.4-1.5.  Likely diabetic nephropathy with proteinuria.  Monitor  for now.     Code Status : Full code  Family Communication  : None  Disposition Plan  : Home possibly in the next 48 hours  Barriers For Discharge : Active symptoms  Consults  : None  Procedures  : CT abdomen, I&D  DVT Prophylaxis  :  Lovenox -  Lab Results  Component Value Date   PLT 204 03/14/2019    Antibiotics  :   Anti-infectives (From admission, onward)   Start     Dose/Rate Route Frequency Ordered Stop   03/14/19 1600  vancomycin (VANCOCIN) IVPB 1000 mg/200 mL premix     1,000 mg 200 mL/hr over 60 Minutes Intravenous Every 12 hours 03/14/19 0208     03/14/19 0030  vancomycin (VANCOCIN) 2,000 mg in sodium chloride 0.9 % 500 mL IVPB     2,000 mg 250 mL/hr over 120 Minutes Intravenous  Once 03/13/19 2338 03/14/19 0443   03/13/19 2359  ceFEPIme (MAXIPIME) 2 g in sodium chloride 0.9 % 100 mL IVPB     2 g 200 mL/hr over 30 Minutes Intravenous Every 8 hours 03/13/19 2338     03/13/19 1600  clindamycin (CLEOCIN) IVPB 600 mg     600 mg 100 mL/hr over 30 Minutes Intravenous  Once 03/13/19 1546 03/13/19 1657        Objective:   Vitals:   03/13/19 2018 03/14/19 0546 03/14/19 1010 03/14/19 1354  BP: (!) 140/99 (!) 166/99 (!) 159/100 (!) 143/92  Pulse: 85 90 88 90  Resp: 15 18 18 18   Temp: 98.1 F (36.7 C) 99.3 F (37.4 C) 99.2 F (37.3 C) 99.4 F (37.4 C)  TempSrc: Oral Oral Oral Oral  SpO2: 96% 95% 96% 94%  Weight:      Height:        Wt Readings from Last 3 Encounters:  03/13/19 125.1 kg  01/07/19 117.9 kg  07/02/17 116.1 kg      Intake/Output Summary (Last 24 hours) at 03/14/2019 1455 Last data filed at 03/14/2019 0553 Gross per 24 hour  Intake 2084.83 ml  Output 1 ml  Net 2083.83 ml     Physical Exam  Gen: not in distress HEENT: moist mucosa, supple neck Chest: clear b/l, no added sounds CVS: N S1&S2, no murmurs,  GI: Soft, nondistended, mild erythema over lower abdomen/suprapubic area with wound packing, minimal tenderness Musculoskeletal: warm, no edema     Data Review:    CBC Recent Labs  Lab 03/13/19 1348 03/14/19 0045 03/14/19 0520  WBC 13.7* 11.8* 12.4*  HGB 12.0* 12.7* 11.6*  HCT 37.6* 40.6 37.0*  PLT 207 222 204  MCV 84.7 87.1 88.3  MCH 27.0 27.3 27.7  MCHC 31.9 31.3 31.4  RDW 12.7 13.0 13.0  LYMPHSABS 1.3  --   --   MONOABS 1.0  --   --   EOSABS 0.1  --   --   BASOSABS 0.0  --   --     Chemistries  Recent Labs  Lab 03/13/19 1348 03/14/19 0045 03/14/19 0520  NA 132*  --  139  K 4.0  --  3.6  CL 102  --  107  CO2 22  --  22  GLUCOSE 578*  --  300*  BUN 25*  --  22*  CREATININE 1.55* 1.37* 1.34*  CALCIUM 9.1  --  8.4*  AST 19  --  13*  ALT 20  --  18  ALKPHOS 84  --  73  BILITOT 0.6  --  0.4   ------------------------------------------------------------------------------------------------------------------ No results for input(s): CHOL, HDL, LDLCALC, TRIG, CHOLHDL, LDLDIRECT in the last 72 hours.  Lab Results  Component Value Date   HGBA1C 14.7 (H) 03/14/2019   ------------------------------------------------------------------------------------------------------------------ No results for input(s): TSH, T4TOTAL, T3FREE, THYROIDAB in the last 72 hours.  Invalid input(s): FREET3 ------------------------------------------------------------------------------------------------------------------ No results for input(s): VITAMINB12, FOLATE, FERRITIN, TIBC, IRON, RETICCTPCT in the last 72 hours.  Coagulation profile No results for input(s): INR, PROTIME in  the last 168 hours.  No results for input(s): DDIMER in the last 72 hours.  Cardiac Enzymes No results for input(s): CKMB, TROPONINI, MYOGLOBIN in the last 168 hours.  Invalid input(s): CK ------------------------------------------------------------------------------------------------------------------ No results found for: BNP  Inpatient Medications  Scheduled Meds:  aspirin EC  81 mg Oral Daily   atorvastatin  20 mg Oral Daily   enoxaparin (LOVENOX) injection  60 mg Subcutaneous Daily   lisinopril  20 mg Oral Daily   And   hydrochlorothiazide  25 mg Oral Daily   insulin aspart  0-15 Units Subcutaneous TID WC   insulin aspart  0-5 Units Subcutaneous QHS   insulin aspart  12 Units Subcutaneous TID WC   insulin glargine  80 Units Subcutaneous Daily   metoprolol tartrate  50 mg Oral BID   Continuous Infusions:  sodium chloride Stopped (03/13/19 1657)   sodium chloride 125 mL/hr at 03/14/19 1311   ceFEPime (MAXIPIME) IV 2 g (03/14/19 1000)   vancomycin     PRN Meds:.sodium chloride, acetaminophen **OR** acetaminophen, morphine injection, nitroGLYCERIN, ondansetron **OR** ondansetron (ZOFRAN) IV  Micro Results Recent Results (from the past 240 hour(s))  Urine culture     Status: Abnormal   Collection Time: 03/13/19  1:48 PM   Specimen: In/Out Cath Urine  Result Value Ref Range Status   Specimen Description   Final    IN/OUT CATH URINE Performed at [facility], [address] [place], [place] [address]    Special Requests   Final    NONE Performed at [facility], [address] [place], [place] [address]    Culture (A)  Final    <10,000 COLONIES/mL INSIGNIFICANT GROWTH Performed at [facility], [place] [address], [place], [place] [address]    Report Status 03/14/2019 FINAL  Final  SARS CORONAVIRUS 2 (TAT 6-24 HRS) Nasopharyngeal Nasopharyngeal Swab     Status: None   Collection Time: 03/13/19  4:21 PM   Specimen:  Nasopharyngeal Swab  Result Value Ref Range Status   SARS Coronavirus 2 NEGATIVE NEGATIVE Final    Comment: (NOTE) SARS-CoV-2 target nucleic acids are NOT DETECTED. The SARS-CoV-2 RNA is generally detectable in upper and lower respiratory specimens during the acute phase of infection. Negative results do not preclude SARS-CoV-2 infection, do not rule out co-infections with other pathogens, and should not be used as the sole basis for treatment or other patient management decisions. Negative results must be combined with clinical observations, patient history, and epidemiological information. The expected result is Negative. Fact Sheet for Patients: SugarRoll.be Fact Sheet for Healthcare Providers: https://www.woods-mathews.com/ This test is not yet approved or cleared by the [place] FDA and  has been authorized for detection and/or diagnosis of SARS-CoV-2 by FDA under an Emergency Use Authorization (EUA). This EUA will remain  in effect (meaning this test can be used) for the duration of the COVID-19 declaration under Section 56 4(b)(1) of the Act, 21 U.S.C. section 360bbb-3(b)(1), unless the authorization is terminated or revoked sooner. Performed  at [facility], [address] [place], [place] [address]   Wound or Superficial Culture     Status: None (Preliminary result)   Collection Time: 03/13/19  5:02 PM   Specimen: Abscess; Wound  Result Value Ref Range Status   Specimen Description   Final    ABSCESS LOWER ABDOMEN Performed at [facility], [address] [place], [place] [address]    Special Requests   Final    NONE Performed at [facility], [address] [place], [place] [address]    Gram Stain   Final    ABUNDANT WBC PRESENT, PREDOMINANTLY PMN RARE GRAM POSITIVE RODS    Culture   Final    TOO YOUNG TO READ Performed at [facility], [address], [place], [place] [address]     Report Status PENDING  Incomplete    Radiology Reports Ct Abdomen Pelvis W Contrast  Result Date: 03/13/2019 CLINICAL DATA:  Lower abdominal pain. EXAM: CT ABDOMEN AND PELVIS WITH CONTRAST TECHNIQUE: Multidetector CT imaging of the abdomen and pelvis was performed using the standard protocol following bolus administration of intravenous contrast. CONTRAST:  80mL OMNIPAQUE IOHEXOL 300 MG/ML  SOLN COMPARISON:  April 28, 2016. FINDINGS: Lower chest: Mild bilateral posterior basilar subsegmental atelectasis is noted. Hepatobiliary: No focal liver abnormality is seen. No gallstones, gallbladder wall thickening, or biliary dilatation. Pancreas: Unremarkable. No pancreatic ductal dilatation or surrounding inflammatory changes. Spleen: Normal in size without focal abnormality. Adrenals/Urinary Tract: Adrenal glands are unremarkable. Kidneys are normal, without renal calculi, focal lesion, or hydronephrosis. Bladder is unremarkable. Stomach/Bowel: Stomach is within normal limits. Appendix appears normal. No evidence of bowel wall thickening, distention, or inflammatory changes. Vascular/Lymphatic: No significant vascular findings are present. No enlarged abdominal or pelvic lymph nodes. Reproductive: Prostate is unremarkable. Other: No hernia is noted. No ascites is noted. Focal subcutaneous inflammatory changes are seen along the inferior portion of the pannus. Possible 3.2 x 2.0 cm subcutaneous abscess may be present. Musculoskeletal: No acute or significant osseous findings. IMPRESSION: 1. Mild bilateral posterior basilar subsegmental atelectasis. 2. Focal subcutaneous inflammatory changes are seen along the inferior portion of the pannus. Possible 3.2 x 2.0 cm subcutaneous abscess may be present. 3. No other abnormality seen in the abdomen or pelvis. Electronically Signed   By: Lupita Raider M.D.   On: 03/13/2019 15:14    Time Spent in minutes 35   Kennidi Yoshida M.D on 03/14/2019 at 2:55  PM  Between 7am to 7pm - Pager - [phone]  After 7pm go to [email or web address] - password [facility]  [facility] -  Office  [phone]

## 2019-03-15 LAB — BASIC METABOLIC PANEL
Anion gap: 8 (ref 5–15)
Anion gap: 9 (ref 5–15)
BUN: 25 mg/dL — ABNORMAL HIGH (ref 6–20)
BUN: 23 mg/dL — ABNORMAL HIGH (ref 6–20)
CO2: 22 mmol/L (ref 22–32)
CO2: 23 mmol/L (ref 22–32)
Calcium: 8.3 mg/dL — ABNORMAL LOW (ref 8.9–10.3)
Calcium: 8.6 mg/dL — ABNORMAL LOW (ref 8.9–10.3)
Chloride: 105 mmol/L (ref 98–111)
Chloride: 103 mmol/L (ref 98–111)
Creatinine, Ser: 1.17 mg/dL (ref 0.61–1.24)
Creatinine, Ser: 1.21 mg/dL (ref 0.61–1.24)
GFR calc Af Amer: >60 mL/min (ref >60)
GFR calc Af Amer: >60 mL/min (ref >60)
GFR calc non Af Amer: >60 mL/min (ref >60)
GFR calc non Af Amer: >60 mL/min (ref >60)
Glucose, Bld: 295 mg/dL — ABNORMAL HIGH (ref 70–99)
Glucose, Bld: 342 mg/dL — ABNORMAL HIGH (ref 70–99)
Potassium: 3.7 mmol/L (ref 3.5–5.1)
Potassium: 3.7 mmol/L (ref 3.5–5.1)
Sodium: 135 mmol/L (ref 135–145)
Sodium: 135 mmol/L (ref 135–145)

## 2019-03-15 LAB — GLUCOSE, CAPILLARY
Glucose-Capillary: 264 mg/dL — ABNORMAL HIGH (ref 70–99)
Glucose-Capillary: 243 mg/dL — ABNORMAL HIGH (ref 70–99)
Glucose-Capillary: 221 mg/dL — ABNORMAL HIGH (ref 70–99)
Glucose-Capillary: 213 mg/dL — ABNORMAL HIGH (ref 70–99)

## 2019-03-15 LAB — CBC WITH DIFFERENTIAL/PLATELET
Abs Immature Granulocytes: 0.09 10*3/uL — ABNORMAL HIGH (ref 0.00–0.07)
Basophils Absolute: 0.1 10*3/uL (ref 0.0–0.1)
Basophils Relative: 1 %
Eosinophils Absolute: 0.2 10*3/uL (ref 0.0–0.5)
Eosinophils Relative: 2 %
HCT: 37.7 % — ABNORMAL LOW (ref 39.0–52.0)
Hemoglobin: 11.9 g/dL — ABNORMAL LOW (ref 13.0–17.0)
Immature Granulocytes: 1 %
Lymphocytes Relative: 17 %
Lymphs Abs: 1.6 10*3/uL (ref 0.7–4.0)
MCH: 27.3 pg (ref 26.0–34.0)
MCHC: 31.6 g/dL (ref 30.0–36.0)
MCV: 86.5 fL (ref 80.0–100.0)
Monocytes Absolute: 0.8 10*3/uL (ref 0.1–1.0)
Monocytes Relative: 8 %
Neutro Abs: 6.9 10*3/uL (ref 1.7–7.7)
Neutrophils Relative %: 71 %
Platelets: 225 10*3/uL (ref 150–400)
RBC: 4.36 MIL/uL (ref 4.22–5.81)
RDW: 12.7 % (ref 11.5–15.5)
WBC: 9.5 10*3/uL (ref 4.0–10.5)
nRBC: 0 % (ref 0.0–0.2)

## 2019-03-15 MED ORDER — INSULIN GLARGINE 100 UNIT/ML ~~LOC~~ SOLN
50.0000 [IU] | Freq: Two times a day (BID) | SUBCUTANEOUS | Status: DC
  Administered 2019-03-15 – 2019-03-16 (×3): 50 [IU] via SUBCUTANEOUS
  Filled 2019-03-15 (×4): qty 0.5

## 2019-03-15 NOTE — Progress Notes (Signed)
PROGRESS NOTE                                                                                                                                                                                                             Patient Demographics:    Javier Suarez, is a 48 y.o. male, DOB - 04/23/1970, ZOX:[id number]  Admit date - 03/13/2019   Admitting Physician Eddie NorthNishant Dhungel, MD  Outpatient [facility] the [facility]  LOS - 2    Chief Complaint  Patient presents with   Abscess       Brief Narrative 48 year old male with uncontrolled diabetes mellitus on insulin, hypertension, history of gallstones and hyperlipidemia [facility] with 3 days of worsening lower abdominal pain with swelling, tender and draining pus.  In the ED he was found to have significant cellulitis over the lower abdominal wall with abscess in the suprapubic area.  I&D done and patient given IV clindamycin, IV fluids.  His blood glucose was in the 500s without anion gap. Admitted for IV antibiotic and blood glucose management.   Subjective:   Reports his abdominal pain to be better.  Afebrile.   Assessment  & Plan :    Principal Problem:   Abdominal wall cellulitis with abscess Status post I&D.  Still with significant pain and purulent discharge.  Culture remains negative so far.  Continue vancomycin and cefepime and tailor antibiotics based on culture.  Active Problems: Diabetes mellitus type 2, uncontrolled with hyperglycemia A1c close to 15. On NPH 70/30, 60 units twice daily.  Follows with an [facility]in[facility] at the [facility].  Reports that he was previously on Metformin which she did not tolerate well.  He was also on Lantus but for reasons unclear he was switched to 70/30 and his sugars have been very uncontrolled and according to patient, his blood sugar always remains over 300 and he checks it twice a  day. He was started on Lantus 90 units along with premeal aspart of 15 units 3 times a day.  Blood sugar better but is still hyperglycemic in 200s range.  He is on large dose of Lantus so we will split and will start him at 50 units twice daily and continue current premeal and SSI.  Diabetic coordinator consult.  Uncontrolled hypertension Blood pressure very well  controlled.  Continue metoprolol, HCTZ and lisinopril.  Add as needed hydralazine.  Chronic kidney disease stage IIIa Baseline creatinine around 1.4-1.5.  Likely diabetic nephropathy with proteinuria.  Monitor for now.  Better than baseline today.  Code Status : Full code  Family Communication  : None.  Plan of care discussed with patient and he verbalized understanding.  Disposition Plan  : Home possibly in the next 1 to 2 days  Barriers For Discharge : Active symptoms  Consults  : None  Procedures  : CT abdomen, I&D  DVT Prophylaxis  :  Lovenox -  Lab Results  Component Value Date   PLT 225 03/15/2019    Antibiotics  :   Anti-infectives (From admission, onward)   Start     Dose/Rate Route Frequency Ordered Stop   03/14/19 1600  vancomycin (VANCOCIN) IVPB 1000 mg/200 mL premix     1,000 mg 200 mL/hr over 60 Minutes Intravenous Every 12 hours 03/14/19 0208     03/14/19 0030  vancomycin (VANCOCIN) 2,000 mg in sodium chloride 0.9 % 500 mL IVPB     2,000 mg 250 mL/hr over 120 Minutes Intravenous  Once 03/13/19 2338 03/14/19 0443   03/13/19 2359  ceFEPIme (MAXIPIME) 2 g in sodium chloride 0.9 % 100 mL IVPB     2 g 200 mL/hr over 30 Minutes Intravenous Every 8 hours 03/13/19 2338     03/13/19 1600  clindamycin (CLEOCIN) IVPB 600 mg     600 mg 100 mL/hr over 30 Minutes Intravenous  Once 03/13/19 1546 03/13/19 1657        Objective:   Vitals:   03/14/19 1010 03/14/19 1354 03/14/19 2150 03/15/19 0645  BP: (!) 159/100 (!) 143/92 139/90 (!) 143/107  Pulse: 88 90 90 79  Resp: Temp: 99.2 F (37.3 C)  99.4 F (37.4 C) 98.7 F (37.1 C) 99.1 F (37.3 C)  TempSrc: Oral Oral Oral Oral  SpO2: 96% 94% 96% 99%  Weight:      Height:        Wt Readings from Last 3 Encounters:  03/13/19 125.1 kg  01/07/19 117.9 kg  07/02/17 116.1 kg     Intake/Output Summary (Last 24 hours) at 03/15/2019 1015 Last data filed at 03/15/2019 0644 Gross per 24 hour  Intake 1337.44 ml  Output 525 ml  Net 812.44 ml     Physical Exam  General exam: Appears calm and comfortable  Respiratory system: Clear to auscultation. Respiratory effort normal. Cardiovascular system: S1 & S2 heard, RRR. No JVD, murmurs, rubs, gallops or clicks. No pedal edema. Gastrointestinal system: Abdomen is nondistended, soft and nontender. No organomegaly or masses felt. Normal bowel sounds heard. Central nervous system: Alert and oriented. No focal neurological deficits. Extremities: Symmetric 5 x 5 power. Skin: Purulent discharge at lower abdominal fold with surrounding erythema which is warm and tender to touch. Psychiatry: Judgement and insight appear normal. Mood & affect appropriate.     Data Review:    CBC Recent Labs  Lab 03/13/19 1348 03/14/19 0045 03/14/19 0520 03/15/19 0843  WBC 13.7* 11.8* 12.4* 9.5  HGB 12.0* 12.7* 11.6* 11.9*  HCT 37.6* 40.6 37.0* 37.7*  PLT 207 222 204 225  MCV 84.7 87.1 88.3 86.5  MCH 27.0 27.3 27.7 27.3  MCHC 31.9 31.3 31.4 31.6  RDW 12.7 13.0 13.0 12.7  LYMPHSABS 1.3  --   --  1.6  MONOABS 1.0  --   --  0.8  EOSABS 0.1  --   --  0.2  BASOSABS 0.0  --   --  0.1    Chemistries  Recent Labs  Lab 03/13/19 1348 03/14/19 0045 03/14/19 0520 03/15/19 0544 03/15/19 0843  NA 132*  --  139 135 135  K 4.0  --  3.6 3.7 3.7  CL 102  --  107 105 103  CO2 22  --  22 22 23   GLUCOSE 578*  --  300* 295* 342*  BUN 25*  --  22* 25* 23*  CREATININE 1.55* 1.37* 1.34* 1.17 1.21  CALCIUM 9.1  --  8.4* 8.3* 8.6*  AST 19  --  13*  --   --   ALT 20  --  18  --   --   ALKPHOS 84  --  73   --   --   BILITOT 0.6  --  0.4  --   --    ------------------------------------------------------------------------------------------------------------------ No results for input(s): CHOL, HDL, LDLCALC, TRIG, CHOLHDL, LDLDIRECT in the last 72 hours.  Lab Results  Component Value Date   HGBA1C 14.7 (H) 03/14/2019   ------------------------------------------------------------------------------------------------------------------ No results for input(s): TSH, T4TOTAL, T3FREE, THYROIDAB in the last 72 hours.  Invalid input(s): FREET3 ------------------------------------------------------------------------------------------------------------------ No results for input(s): VITAMINB12, FOLATE, FERRITIN, TIBC, IRON, RETICCTPCT in the last 72 hours.  Coagulation profile No results for input(s): INR, PROTIME in the last 168 hours.  No results for input(s): DDIMER in the last 72 hours.  Cardiac Enzymes No results for input(s): CKMB, TROPONINI, MYOGLOBIN in the last 168 hours.  Invalid input(s): CK ------------------------------------------------------------------------------------------------------------------ No results found for: BNP  Inpatient Medications  Scheduled Meds:  aspirin EC  81 mg Oral Daily   atorvastatin  20 mg Oral Daily   lisinopril  20 mg Oral Daily   And   hydrochlorothiazide  25 mg Oral Daily   insulin aspart  0-15 Units Subcutaneous TID WC   insulin aspart  0-5 Units Subcutaneous QHS   insulin aspart  15 Units Subcutaneous TID WC   insulin glargine  50 Units Subcutaneous BID   metoprolol tartrate  50 mg Oral BID   Continuous Infusions:  sodium chloride Stopped (03/13/19 1657)   sodium chloride 125 mL/hr at 03/15/19 0940   ceFEPime (MAXIPIME) IV 2 g (03/15/19 0736)   vancomycin 1,000 mg (03/15/19 0422)   PRN Meds:.sodium chloride, acetaminophen **OR** acetaminophen, hydrALAZINE, morphine injection, nitroGLYCERIN, ondansetron **OR** ondansetron  (ZOFRAN) IV  Micro Results Recent Results (from the past 240 hour(s))  Urine culture     Status: Abnormal   Collection Time: 03/13/19  1:48 PM   Specimen: In/Out Cath Urine  Result Value Ref Range Status   Specimen Description   Final    IN/OUT CATH URINE Performed at [facility], [address] [place], [place] [address]    Special Requests   Final    NONE Performed at [facility], [address] [place], [place] [address]    Culture (A)  Final    <10,000 COLONIES/mL INSIGNIFICANT GROWTH Performed at [facility], [place], [place], [place] [address]    Report Status 03/14/2019 FINAL  Final  SARS CORONAVIRUS 2 (TAT 6-24 HRS) Nasopharyngeal Nasopharyngeal Swab     Status: None   Collection Time: 03/13/19  4:21 PM   Specimen: Nasopharyngeal Swab  Result Value Ref Range Status   SARS Coronavirus 2 NEGATIVE NEGATIVE Final    Comment: (NOTE) SARS-CoV-2 target nucleic acids are NOT DETECTED. The SARS-CoV-2 RNA is generally detectable  in upper and lower respiratory specimens during the acute phase of infection. Negative results do not preclude SARS-CoV-2 infection, do not rule out co-infections with other pathogens, and should not be used as the sole basis for treatment or other patient management decisions. Negative results must be combined with clinical observations, patient history, and epidemiological information. The expected result is Negative. Fact Sheet for Patients: HairSlick.no Fact Sheet for Healthcare Providers: quierodirigir.com This test is not yet approved or cleared by the [place] FDA and  has been authorized for detection and/or diagnosis of SARS-CoV-2 by FDA under an Emergency Use Authorization (EUA). This EUA will remain  in effect (meaning this test can be used) for the duration of the COVID-19 declaration under Section 56 4(b)(1) of the Act, 21 U.S.C. section  360bbb-3(b)(1), unless the authorization is terminated or revoked sooner. Performed at [facility], [address] [place], [place] [address]   Wound or Superficial Culture     Status: None (Preliminary result)   Collection Time: 03/13/19  5:02 PM   Specimen: Abscess; Wound  Result Value Ref Range Status   Specimen Description   Final    ABSCESS LOWER ABDOMEN Performed at [facility], [address] [place], [place] [address]    Special Requests   Final    NONE Performed at [facility], [address] [place], [place] [address]    Gram Stain   Final    ABUNDANT WBC PRESENT, PREDOMINANTLY PMN RARE GRAM POSITIVE RODS    Culture   Final    TOO YOUNG TO READ Performed at [facility], [address], [place], [place] [address]    Report Status PENDING  Incomplete    Radiology Reports Ct Abdomen Pelvis W Contrast  Result Date: 03/13/2019 CLINICAL DATA:  Lower abdominal pain. EXAM: CT ABDOMEN AND PELVIS WITH CONTRAST TECHNIQUE: Multidetector CT imaging of the abdomen and pelvis was performed using the standard protocol following bolus administration of intravenous contrast. CONTRAST:  41mL OMNIPAQUE IOHEXOL 300 MG/ML  SOLN COMPARISON:  April 28, 2016. FINDINGS: Lower chest: Mild bilateral posterior basilar subsegmental atelectasis is noted. Hepatobiliary: No focal liver abnormality is seen. No gallstones, gallbladder wall thickening, or biliary dilatation. Pancreas: Unremarkable. No pancreatic ductal dilatation or surrounding inflammatory changes. Spleen: Normal in size without focal abnormality. Adrenals/Urinary Tract: Adrenal glands are unremarkable. Kidneys are normal, without renal calculi, focal lesion, or hydronephrosis. Bladder is unremarkable. Stomach/Bowel: Stomach is within normal limits. Appendix appears normal. No evidence of bowel wall thickening, distention, or inflammatory changes. Vascular/Lymphatic: No significant vascular findings  are present. No enlarged abdominal or pelvic lymph nodes. Reproductive: Prostate is unremarkable. Other: No hernia is noted. No ascites is noted. Focal subcutaneous inflammatory changes are seen along the inferior portion of the pannus. Possible 3.2 x 2.0 cm subcutaneous abscess may be present. Musculoskeletal: No acute or significant osseous findings. IMPRESSION: 1. Mild bilateral posterior basilar subsegmental atelectasis. 2. Focal subcutaneous inflammatory changes are seen along the inferior portion of the pannus. Possible 3.2 x 2.0 cm subcutaneous abscess may be present. 3. No other abnormality seen in the abdomen or pelvis. Electronically Signed   By: Lupita Raider M.D.   On: 03/13/2019 15:14    Time Spent in minutes 29   Hughie Closs M.D on 03/15/2019 at 10:15 AM  Between 7am to 7pm - Pager - [phone]  After 7pm go to [email or web address] - password [facility]  [facility] -  Office  [phone]

## 2019-03-16 LAB — CBC WITH DIFFERENTIAL/PLATELET
Abs Immature Granulocytes: 0.1 10*3/uL — ABNORMAL HIGH (ref 0.00–0.07)
Basophils Absolute: 0.1 10*3/uL (ref 0.0–0.1)
Basophils Relative: 1 %
Eosinophils Absolute: 0.2 10*3/uL (ref 0.0–0.5)
Eosinophils Relative: 2 %
HCT: 35.5 % — ABNORMAL LOW (ref 39.0–52.0)
Hemoglobin: 11.1 g/dL — ABNORMAL LOW (ref 13.0–17.0)
Immature Granulocytes: 1 %
Lymphocytes Relative: 19 %
Lymphs Abs: 1.5 10*3/uL (ref 0.7–4.0)
MCH: 27.1 pg (ref 26.0–34.0)
MCHC: 31.3 g/dL (ref 30.0–36.0)
MCV: 86.8 fL (ref 80.0–100.0)
Monocytes Absolute: 0.8 10*3/uL (ref 0.1–1.0)
Monocytes Relative: 10 %
Neutro Abs: 5.4 10*3/uL (ref 1.7–7.7)
Neutrophils Relative %: 67 %
Platelets: 207 10*3/uL (ref 150–400)
RBC: 4.09 MIL/uL — ABNORMAL LOW (ref 4.22–5.81)
RDW: 12.7 % (ref 11.5–15.5)
WBC: 8 10*3/uL (ref 4.0–10.5)
nRBC: 0 % (ref 0.0–0.2)

## 2019-03-16 LAB — GLUCOSE, CAPILLARY
Glucose-Capillary: 177 mg/dL — ABNORMAL HIGH (ref 70–99)
Glucose-Capillary: 173 mg/dL — ABNORMAL HIGH (ref 70–99)

## 2019-03-16 LAB — AEROBIC CULTURE W GRAM STAIN (SUPERFICIAL SPECIMEN)

## 2019-03-16 MED ORDER — DOXYCYCLINE MONOHYDRATE 100 MG PO TABS: 100.0000 mg | ORAL_TABLET | Freq: Two times a day (BID) | ORAL | 0 refills | Status: AC

## 2019-03-16 MED ORDER — INSULIN GLARGINE 100 UNIT/ML ~~LOC~~ SOLN: 50.0000 [IU] | Freq: Two times a day (BID) | SUBCUTANEOUS | 1 refills | Status: AC

## 2019-03-16 MED ORDER — AMOXICILLIN-POT CLAVULANATE 875-125 MG PO TABS: 1.0000 | ORAL_TABLET | Freq: Two times a day (BID) | ORAL | 0 refills | Status: AC

## 2019-03-16 MED ORDER — LIVING WELL WITH DIABETES BOOK
Freq: Once | Status: AC
  Administered 2019-03-16: 14:00:00
  Filled 2019-03-16: qty 1

## 2019-03-16 MED ORDER — INSULIN ASPART 100 UNIT/ML ~~LOC~~ SOLN: 5.0000 [IU] | Freq: Three times a day (TID) | SUBCUTANEOUS | 1 refills | Status: AC

## 2019-03-16 MED ORDER — INSULIN ASPART 100 UNIT/ML ~~LOC~~ SOLN: 15.0000 [IU] | Freq: Three times a day (TID) | SUBCUTANEOUS | 1 refills | Status: AC

## 2019-03-16 MED ORDER — LISINOPRIL-HYDROCHLOROTHIAZIDE 20-12.5 MG PO TABS: 1.0000 | ORAL_TABLET | Freq: Two times a day (BID) | ORAL | 0 refills | Status: AC

## 2019-03-16 MED FILL — Insulin Regular (Human) Inj 100 Unit/ML: INTRAMUSCULAR | Qty: 0.1 | Status: AC

## 2019-03-16 NOTE — Discharge Instructions (Signed)
Skin Abscess  A skin abscess is an infected area on or under your skin that contains a collection of pus and other material. An abscess may also be called a furuncle, carbuncle, or boil. An abscess can occur in or on almost any part of your body. Some abscesses break open (rupture) on their own. Most continue to get worse unless they are treated. The infection can spread deeper into the body and eventually into your blood, which can make you feel ill. Treatment usually involves draining the abscess. What are the causes? An abscess occurs when germs, like bacteria, pass through your skin and cause an infection. This may be caused by:  A scrape or cut on your skin.  A puncture wound through your skin, including a needle injection or insect bite.  Blocked oil or sweat glands.  Blocked and infected hair follicles.  A cyst that forms beneath your skin (sebaceous cyst) and becomes infected. What increases the risk? This condition is more likely to develop in people who:  Have a weak body defense system (immune system).  Have diabetes.  Have dry and irritated skin.  Get frequent injections or use illegal IV drugs.  Have a foreign body in a wound, such as a splinter.  Have problems with their lymph system or veins. What are the signs or symptoms? Symptoms of this condition include:  A painful, firm bump under the skin.  A bump with pus at the top. This may break through the skin and drain. Other symptoms include:  Redness surrounding the abscess site.  Warmth.  Swelling of the lymph nodes (glands) near the abscess.  Tenderness.  A sore on the skin. How is this diagnosed? This condition may be diagnosed based on:  A physical exam.  Your medical history.  A sample of pus. This may be used to find out what is causing the infection.  Blood tests.  Imaging tests, such as an ultrasound, CT scan, or MRI. How is this treated? A small abscess that drains on its own may not  need treatment. Treatment for larger abscesses may include:  Moist heat or heat pack applied to the area several times a day.  A procedure to drain the abscess (incision and drainage).  Antibiotic medicines. For a severe abscess, you may first get antibiotics through an IV and then change to antibiotics by mouth. Follow these instructions at home: Medicines   Take over-the-counter and prescription medicines only as told by your health care provider.  If you were prescribed an antibiotic medicine, take it as told by your health care provider. Do not stop taking the antibiotic even if you start to feel better. Abscess care   If you have an abscess that has not drained, apply heat to the affected area. Use the heat source that your health care provider recommends, such as a moist heat pack or a heating pad. ? Place a towel between your skin and the heat source. ? Leave the heat on for 20-30 minutes. ? Remove the heat if your skin turns bright red. This is especially important if you are unable to feel pain, heat, or cold. You may have a greater risk of getting burned.  Follow instructions from your health care provider about how to take care of your abscess. Make sure you: ? Cover the abscess with a bandage (dressing). ? Change your dressing or gauze as told by your health care provider. ? Wash your hands with soap and water before you change the   dressing or gauze. If soap and water are not available, use hand sanitizer.  Check your abscess every day for signs of a worsening infection. Check for: ? More redness, swelling, or pain. ? More fluid or blood. ? Warmth. ? More pus or a bad smell. General instructions  To avoid spreading the infection: ? Do not share personal care items, towels, or hot tubs with others. ? Avoid making skin contact with other people.  Keep all follow-up visits as told by your health care provider. This is important. Contact a health care provider if you  have:  More redness, swelling, or pain around your abscess.  More fluid or blood coming from your abscess.  Warm skin around your abscess.  More pus or a bad smell coming from your abscess.  A fever.  Muscle aches.  Chills or a general ill feeling. Get help right away if you:  Have severe pain.  See red streaks on your skin spreading away from the abscess. Summary  A skin abscess is an infected area on or under your skin that contains a collection of pus and other material.  A small abscess that drains on its own may not need treatment.  Treatment for larger abscesses may include having a procedure to drain the abscess and taking an antibiotic. This information is not intended to replace advice given to you by your health care provider. Make sure you discuss any questions you have with your health care provider. Document Released: 01/10/2005 Document Revised: 07/24/2018 Document Reviewed: 05/16/2017 Elsevier Patient Education  2020 [employer]  

## 2019-03-16 NOTE — Discharge Summary (Addendum)
Physician Discharge Summary  Francene BoyersStephen L Fawcett ZOX:[id number] DOB: 08/20/1970 DOA: 03/13/2019  PCP: [facility]  Admit date: 03/13/2019 Discharge date: 03/16/2019  Admitted From: Home Disposition: Home  Recommendations for Outpatient Follow-up:  1. Follow up with PCP in 1-2 weeks 2. Please obtain BMP/CBC in one week 3. Please follow up on the following pending results:  Home Health: None Equipment/Devices: None  Discharge Condition: Stable CODE STATUS: Full code Diet recommendation: Diabetic  Subjective: Seen and examined.  Feels much better.  No abdominal pain.  No fever.  Brief/Interim Summary: 48 year old male with uncontrolled diabetes mellitus on insulin, hypertension, history of gallstones and hyperlipidemia presented [facility] with 3 days of worsening lower abdominal pain with swelling, tender and draining pus.  In the ED he was found to have significant cellulitis over the lower abdominal wall with abscess in the suprapubic area.  I&D done and patient given IV clindamycin, IV fluids.  His blood glucose was in the 500s without anion gap.  He was subsequently [facility].  No blood culture were drawn.  His abscess culture is growing gram-positive rods, no identification known.  He has remained afebrile and has improved significantly in last 2 days.  He has received cefepime and vancomycin during his brief hospitalization.  I discussed with ID on-call Dr. Daiva EvesVan Dam who has recommended oral Augmentin in addition to oral doxycycline for 8 days.  Patient also came in with significant hyperglycemia.  Patient was using insulin 70/30 60 units twice daily and despite of that, his blood sugars remain over 300 at home according to patient.  Patient was not happy with his regimen.  Here, he was started on Lantus, initially 90 units in the morning and then increased to 50 units twice daily and his blood sugar is fairly  controlled.  He was also started on 15 units NovoLog premeal regimen 3 times daily.  After long discussion with patient, patient is not happy with how his diabetes is being [facility] and he wants me to prescribe him Lantus.  I have prescribed Lantus 50 units twice daily as well as NovoLog 15 units 3 times daily Premeal regimen.  He will need to follow-up with his PCP and regular physicians within a week.  Per his request, I have printed all the scripts so he can get those filled at the [facility].  He also had slightly elevated diastolic blood pressure so I have increased his lisinopril.  He was initially on 20 mg lisinopril and 25 mg of hydrochlorothiazide combination.  I have increased it to 40 mg of lisinopril and continue 25 mg of hydrochlorothiazide.  Discharge Diagnoses:  Principal Problem:   Abdominal wall abscess Active Problems:   Diabetes (HCC)   Benign essential HTN   Type 2 diabetes mellitus with stage 3 chronic kidney disease (HCC)   Uncontrolled type 2 diabetes mellitus with hyperglycemia, with long-term current use of insulin (HCC)   Uncontrolled hypertension   Panniculitis    Discharge Instructions  Discharge Instructions    Ambulatory referral to Nutrition and Diabetic Education   Complete by: As directed    Discharge patient   Complete by: As directed    Discharge disposition: 01-Home or Self Care   Discharge patient date: 03/16/2019     Allergies as of 03/16/2019      Reactions   Food    Coconut. Swelling    Metformin And Related Diarrhea  Metformin Diarrhea      Medication List    STOP taking these medications   lisinopril-hydrochlorothiazide 20-25 MG tablet Commonly known as: ZESTORETIC Replaced by: lisinopril-hydrochlorothiazide 20-12.5 MG tablet   NovoLOG Mix 70/30 FlexPen (70-30) 100 UNIT/ML FlexPen Generic drug: insulin aspart protamine - aspart     TAKE these medications   amoxicillin-clavulanate 875-125 MG tablet Commonly known as:  Augmentin Take 1 tablet by mouth 2 (two) times daily for 8 days.   aspirin EC 81 MG tablet Take 1 tablet by mouth daily.   atorvastatin 20 MG tablet Commonly known as: LIPITOR Take 20 mg by mouth daily.   doxycycline 100 MG tablet Commonly known as: ADOXA Take 1 tablet (100 mg total) by mouth 2 (two) times daily for 8 days.   insulin aspart 100 UNIT/ML injection Commonly known as: NovoLOG Inject 15 Units into the skin 3 (three) times daily before meals.   insulin glargine 100 UNIT/ML injection Commonly known as: LANTUS Inject 0.5 mLs (50 Units total) into the skin 2 (two) times daily.   lisinopril-hydrochlorothiazide 20-12.5 MG tablet Commonly known as: Zestoretic Take 1 tablet by mouth 2 (two) times daily. Replaces: lisinopril-hydrochlorothiazide 20-25 MG tablet   metoprolol tartrate 50 MG tablet Commonly known as: LOPRESSOR Take 50 mg by mouth 2 (two) times daily.   nitroGLYCERIN 0.4 MG SL tablet Commonly known as: NITROSTAT Place 1 tablet (0.4 mg total) under the tongue every 5 (five) minutes as needed for chest pain (up to 3 doses).   Polyethyl Glycol-Propyl Glycol 0.4-0.3 % Gel ophthalmic gel Commonly known as: Systane Place 1 application into the right eye at bedtime.      Follow-up Information    [facility] Follow up in 1 week(s).   Specialty: General Practice Contact information: [address] [place] [address] [phone]          Allergies  Allergen Reactions  . Food     Coconut. Swelling   . Metformin And Related Diarrhea  . Metformin Diarrhea    Consultations: None   Procedures/Studies: Ct Abdomen Pelvis W Contrast  Result Date: 03/13/2019 CLINICAL DATA:  Lower abdominal pain. EXAM: CT ABDOMEN AND PELVIS WITH CONTRAST TECHNIQUE: Multidetector CT imaging of the abdomen and pelvis was performed using the standard protocol following bolus administration of intravenous contrast. CONTRAST:  80mL OMNIPAQUE IOHEXOL 300 MG/ML   SOLN COMPARISON:  April 28, 2016. FINDINGS: Lower chest: Mild bilateral posterior basilar subsegmental atelectasis is noted. Hepatobiliary: No focal liver abnormality is seen. No gallstones, gallbladder wall thickening, or biliary dilatation. Pancreas: Unremarkable. No pancreatic ductal dilatation or surrounding inflammatory changes. Spleen: Normal in size without focal abnormality. Adrenals/Urinary Tract: Adrenal glands are unremarkable. Kidneys are normal, without renal calculi, focal lesion, or hydronephrosis. Bladder is unremarkable. Stomach/Bowel: Stomach is within normal limits. Appendix appears normal. No evidence of bowel wall thickening, distention, or inflammatory changes. Vascular/Lymphatic: No significant vascular findings are present. No enlarged abdominal or pelvic lymph nodes. Reproductive: Prostate is unremarkable. Other: No hernia is noted. No ascites is noted. Focal subcutaneous inflammatory changes are seen along the inferior portion of the pannus. Possible 3.2 x 2.0 cm subcutaneous abscess may be present. Musculoskeletal: No acute or significant osseous findings. IMPRESSION: 1. Mild bilateral posterior basilar subsegmental atelectasis. 2. Focal subcutaneous inflammatory changes are seen along the inferior portion of the pannus. Possible 3.2 x 2.0 cm subcutaneous abscess may be present. 3. No other abnormality seen in the abdomen or pelvis. Electronically Signed   By: Lupita Raider  M.D.   On: 03/13/2019 15:14     Discharge Exam: Vitals:   03/15/19 2215 03/16/19 0622  BP: (!) 150/97 (!) 148/103  Pulse: 83 71  Resp: 17 17  Temp: 99 F (37.2 C) 98.7 F (37.1 C)  SpO2: 98% 99%   Vitals:   03/15/19 0645 03/15/19 1422 03/15/19 2215 03/16/19 0622  BP: (!) 143/107 (!) 154/88 (!) 150/97 (!) 148/103  Pulse: 79 85 83 71  Resp: 18 17 17 17   Temp: 99.1 F (37.3 C) 99.5 F (37.5 C) 99 F (37.2 C) 98.7 F (37.1 C)  TempSrc: Oral Oral Oral Oral  SpO2: 99% 95% 98% 99%  Weight:       Height:        General: Pt is alert, awake, not in acute distress Cardiovascular: RRR, S1/S2 +, no rubs, no gallops Respiratory: CTA bilaterally, no wheezing, no rhonchi Abdominal: Soft, NT, ND, bowel sounds +.  Very minimal erythema at suprapubic area.  It is nontender and not warm anymore. Extremities: no edema, no cyanosis    The results of significant diagnostics from this hospitalization (including imaging, microbiology, ancillary and laboratory) are listed below for reference.     Microbiology: Recent Results (from the past 240 hour(s))  Urine culture     Status: Abnormal   Collection Time: 03/13/19  1:48 PM   Specimen: In/Out Cath Urine  Result Value Ref Range Status   Specimen Description   Final    IN/OUT CATH URINE Performed at [facility], [address] [place], [place] [address]    Special Requests   Final    NONE Performed at [facility], [address] [place], [place] [address]    Culture (A)  Final    <10,000 COLONIES/mL INSIGNIFICANT GROWTH Performed at [facility], [place] [address], [place], [place] [address]    Report Status 03/14/2019 FINAL  Final  SARS CORONAVIRUS 2 (TAT 6-24 HRS) Nasopharyngeal Nasopharyngeal Swab     Status: None   Collection Time: 03/13/19  4:21 PM   Specimen: Nasopharyngeal Swab  Result Value Ref Range Status   SARS Coronavirus 2 NEGATIVE NEGATIVE Final    Comment: (NOTE) SARS-CoV-2 target nucleic acids are NOT DETECTED. The SARS-CoV-2 RNA is generally detectable in upper and lower respiratory specimens during the acute phase of infection. Negative results do not preclude SARS-CoV-2 infection, do not rule out co-infections with other pathogens, and should not be used as the sole basis for treatment or other patient management decisions. Negative results must be combined with clinical observations, patient history, and epidemiological information. The expected result is Negative. Fact Sheet for  Patients: SugarRoll.be Fact Sheet for Healthcare Providers: https://www.woods-mathews.com/ This test is not yet approved or cleared by the [place] FDA and  has been authorized for detection and/or diagnosis of SARS-CoV-2 by FDA under an Emergency Use Authorization (EUA). This EUA will remain  in effect (meaning this test can be used) for the duration of the COVID-19 declaration under Section 56 4(b)(1) of the Act, 21 U.S.C. section 360bbb-3(b)(1), unless the authorization is terminated or revoked sooner. Performed at [facility], [place] [address], [place], [place] [address]   Wound or Superficial Culture     Status: Abnormal   Collection Time: 03/13/19  5:02 PM   Specimen: Abscess; Wound  Result Value Ref Range Status   Specimen Description   Final    ABSCESS LOWER ABDOMEN Performed at [facility], [phone]  [address] [place], [place] [address]    Special Requests   Final    NONE Performed at [facility], [address] [place], [place] [address]    Gram Stain   Final    ABUNDANT WBC PRESENT, PREDOMINANTLY PMN RARE GRAM POSITIVE RODS    Culture (A)  Final    MULTIPLE ORGANISMS PRESENT, NONE PREDOMINANT NO GROUP A STREP (S.PYOGENES) ISOLATED NO STAPHYLOCOCCUS AUREUS ISOLATED Performed at [facility], [address] [place], [place] [address]    Report Status 03/16/2019 FINAL  Final     Labs: BNP (last 3 results) No results for input(s): BNP in the last 8760 hours. Basic Metabolic Panel: Recent Labs  Lab 03/13/19 1348 03/14/19 0045 03/14/19 0520 03/15/19 0544 03/15/19 0843  NA 132*  --  139 135 135  K 4.0  --  3.6 3.7 3.7  CL 102  --  107 105 103  CO2 22  --  22 22 23   GLUCOSE 578*  --  300* 295* 342*  BUN 25*  --  22* 25* 23*  CREATININE 1.55* 1.37* 1.34* 1.17 1.21  CALCIUM 9.1  --  8.4* 8.3* 8.6*   Liver Function Tests: Recent Labs  Lab 03/13/19 1348 03/14/19 0520  AST 19  13*  ALT 20 18  ALKPHOS 84 73  BILITOT 0.6 0.4  PROT 6.9 6.3*  ALBUMIN 3.0* 2.7*   No results for input(s): LIPASE, AMYLASE in the last 168 hours. No results for input(s): AMMONIA in the last 168 hours. CBC: Recent Labs  Lab 03/13/19 1348 03/14/19 0045 03/14/19 0520 03/15/19 0843 03/16/19 0511  WBC 13.7* 11.8* 12.4* 9.5 8.0  NEUTROABS 11.2*  --   --  6.9 5.4  HGB 12.0* 12.7* 11.6* 11.9* 11.1*  HCT 37.6* 40.6 37.0* 37.7* 35.5*  MCV 84.7 87.1 88.3 86.5 86.8  PLT 207 222 204 225 207   Cardiac Enzymes: No results for input(s): CKTOTAL, CKMB, CKMBINDEX, TROPONINI in the last 168 hours. BNP: Invalid input(s): POCBNP CBG: Recent Labs  Lab 03/15/19 1218 03/15/19 1720 03/15/19 2213 03/16/19 0736 03/16/19 1133  GLUCAP 243* 221* 213* 177* 173*   D-Dimer No results for input(s): DDIMER in the last 72 hours. Hgb A1c Recent Labs    03/14/19 0045  HGBA1C 14.7*   Lipid Profile No results for input(s): CHOL, HDL, LDLCALC, TRIG, CHOLHDL, LDLDIRECT in the last 72 hours. Thyroid function studies No results for input(s): TSH, T4TOTAL, T3FREE, THYROIDAB in the last 72 hours.  Invalid input(s): FREET3 Anemia work up No results for input(s): VITAMINB12, FOLATE, FERRITIN, TIBC, IRON, RETICCTPCT in the last 72 hours. Urinalysis    Component Value Date/Time   COLORURINE YELLOW 03/13/2019 1348   APPEARANCEUR CLEAR 03/13/2019 1348   LABSPEC 1.010 03/13/2019 1348   PHURINE 5.5 03/13/2019 1348   GLUCOSEU >=500 (A) 03/13/2019 1348   HGBUR SMALL (A) 03/13/2019 1348   BILIRUBINUR NEGATIVE 03/13/2019 1348   KETONESUR 15 (A) 03/13/2019 1348   PROTEINUR 30 (A) 03/13/2019 1348   NITRITE NEGATIVE 03/13/2019 1348   LEUKOCYTESUR NEGATIVE 03/13/2019 1348   Sepsis Labs Invalid input(s): PROCALCITONIN,  WBC,  LACTICIDVEN Microbiology Recent Results (from the past 240 hour(s))  Urine culture     Status: Abnormal   Collection Time: 03/13/19  1:48 PM   Specimen: In/Out Cath Urine  Result  Value Ref Range Status   Specimen Description   Final    IN/OUT CATH URINE Performed at [facility], [address] [place], [place] [address]    Special Requests   Final    NONE Performed at [facility], [address] [place], [place] [address]    Culture (A)  Final    <10,000 COLONIES/mL INSIGNIFICANT GROWTH Performed at [facility], [address] [place], [place] [address]    Report Status 03/14/2019 FINAL  Final  SARS CORONAVIRUS 2 (TAT 6-24 HRS) Nasopharyngeal Nasopharyngeal Swab     Status: None   Collection Time: 03/13/19  4:21 PM   Specimen: Nasopharyngeal Swab  Result Value Ref Range Status   SARS Coronavirus 2 NEGATIVE NEGATIVE Final    Comment: (NOTE) SARS-CoV-2 target nucleic acids are NOT DETECTED. The SARS-CoV-2 RNA is generally detectable in upper and lower respiratory specimens during the acute phase of infection. Negative results do not preclude SARS-CoV-2 infection, do not rule out co-infections with other pathogens, and should not be used as the sole basis for treatment or other patient management decisions. Negative results must be combined with clinical observations, patient history, and epidemiological information. The expected result is Negative. Fact Sheet for Patients: HairSlick.no Fact Sheet for Healthcare Providers: quierodirigir.com This test is not yet approved or cleared by the [place] FDA and  has been authorized for detection and/or diagnosis of SARS-CoV-2 by FDA under an Emergency Use Authorization (EUA). This EUA will remain  in effect (meaning this test can be used) for the duration of the COVID-19 declaration under Section 56 4(b)(1) of the Act, 21 U.S.C. section 360bbb-3(b)(1), unless the authorization is terminated or revoked sooner. Performed at [facility], [address], [place], [place] [address]   Wound or Superficial Culture      Status: Abnormal   Collection Time: 03/13/19  5:02 PM   Specimen: Abscess; Wound  Result Value Ref Range Status   Specimen Description   Final    ABSCESS LOWER ABDOMEN Performed at [facility], [address] [place], [place] [address]    Special Requests   Final    NONE Performed at [facility], [name] [place], [place] [address]    Gram Stain   Final    ABUNDANT WBC PRESENT, PREDOMINANTLY PMN RARE GRAM POSITIVE RODS    Culture (A)  Final    MULTIPLE ORGANISMS PRESENT, NONE PREDOMINANT NO GROUP A STREP (S.PYOGENES) ISOLATED NO STAPHYLOCOCCUS AUREUS ISOLATED Performed at [facility], [address] [place], [place] [address]    Report Status 03/16/2019 FINAL  Final     Time coordinating discharge: Over 30 minutes  SIGNED:   Hughie Closs, MD  [facility] 03/16/2019, 1:29 PM  If 7PM-7AM, please contact night-coverage [email or web address] Password [email or web address]

## 2019-03-16 NOTE — Progress Notes (Signed)
Inpatient Diabetes Program Recommendations  AACE/ADA: New Consensus Statement on Inpatient Glycemic Control (2015)  Target Ranges:  Prepandial:   less than 140 mg/dL      Peak postprandial:   less than 180 mg/dL (1-2 hours)      Critically ill patients:  140 - 180 mg/dL   Lab Results  Component Value Date   GLUCAP 173 (H) 03/16/2019   HGBA1C 14.7 (H) 03/14/2019    Review of Glycemic Control  Diabetes history: DM2 Outpatient Diabetes medications: 70/30 80 units bid Current orders for Inpatient glycemic control: Lantus 50 units bid, Novolog 15 units tidwc, Novolog 0-15 units tidwc and 0-5 units QHS  HgbA1C - 14.7% - uncontrolled  Inpatient Diabetes Program Recommendations:     To be discharged on Lantus 50 units bid, Novolog 5 units tidwc  Recommend Novolog 15 units tidwc. OP Diabetes Education  Spoke with pt at length regarding his diabetes control at home. Discussed HgbA1C and goals of 7%. Pt states he sees Endo at [place] in [place] and has not been able to see since March 2020, d/t Covid precautions. Pt states his blood sugars have been running high and he's called MD. Insulin orders have been increased but still has remained elevated. Long discussion about checking blood sugars 3-4x/day and calling MD if blood sugars consistently > 200 mg/dL. Discussed diet, exercise and stress management. Pt states he walks at work, but needs to exercise outside of work by walking at track/neighborhood. Has never had any OP Diabetes education. Does have meter and supplies at home. Instructed to chose high fiber over low fiber, leave off Gatorade, sodas, sweet tea. Pt is frustrated that he can't seem to control his blood sugars and it may take a week to hear back from MD at [facility]. Told pt to f/u with PCP and let him know of his concern. Answered all questons. Will secure text MD regarding increasing Novolog for d/c to 15 units tidwc. Discussed how both Lantus and Novolog work differently from 70/30  insulin. Ordered Living Well book and OP Diabetes Educaiton. Discussed with RN.  Thank you. Lorenda Peck, RD, LDN, CDE Inpatient Diabetes Coordinator [phone]

## 2019-03-16 NOTE — Progress Notes (Signed)
Received page from RN caring for pt at 10:28am.  RN requesting the DM Coordinator to see pt prior to d/c today to answer pt's questions about his home diabetes care.  Message sent to DM Coordinator covering [place] [facility] at 10:30am.  DM Coordinator to see pt prior to d/c today.    Wyn Quaker RN, MSN, CDE Diabetes Coordinator Inpatient Glycemic Control Team Team Pager: [phone] (8a-5p)

## 2019-05-04 ENCOUNTER — Ambulatory Visit: Payer: Federal, State, Local not specified - PPO | Admitting: Registered"

## 2019-05-22 ENCOUNTER — Encounter: Payer: Self-pay | Admitting: Registered"

## 2019-05-22 ENCOUNTER — Encounter: Payer: Federal, State, Local not specified - PPO | Attending: Family Medicine | Admitting: Registered"

## 2019-05-22 ENCOUNTER — Other Ambulatory Visit: Payer: Self-pay

## 2019-05-22 DIAGNOSIS — N1831 Chronic kidney disease, stage 3a: Secondary | ICD-10-CM | POA: Insufficient documentation

## 2019-05-22 DIAGNOSIS — Z794 Long term (current) use of insulin: Secondary | ICD-10-CM | POA: Diagnosis not present

## 2019-05-22 DIAGNOSIS — E1122 Type 2 diabetes mellitus with diabetic chronic kidney disease: Secondary | ICD-10-CM | POA: Diagnosis not present

## 2019-05-22 DIAGNOSIS — Z713 Dietary counseling and surveillance: Secondary | ICD-10-CM | POA: Diagnosis present

## 2019-05-22 DIAGNOSIS — N183 Chronic kidney disease, stage 3 unspecified: Secondary | ICD-10-CM

## 2019-05-22 NOTE — Patient Instructions (Addendum)
Read the instruction with your insulin to see how long you are able to leave out of refrigeration after opening. Rotate injection sites and leave needle in for count of 5 Inject Novolog closer to 15 min before meals.  Aim to eat balanced meals and snacks and try some of the strategies for eating less pasta and rice.  Consider eating slow and mindfully to enjoy the smaller serving sizes of carbs Consider taking vegetables to work for snacks instead of chips. Consider having less soda.  Follow up with your plans to investigate sleep apnea

## 2019-05-22 NOTE — Progress Notes (Signed)
Diabetes Self-Management Education  Visit Type: First/Initial  Appt. Start Time: 0800 Appt. End Time: 0920  05/22/2019  Mr. Javier Suarez, identified by name and date of birth, is a 49 y.o. male with a diagnosis of Diabetes: Type 2.   ASSESSMENT  There were no vitals taken for this visit. There is no height or weight on file to calculate BMI.   Medications: Pt states he takes Novolog before he starts preparing dinner and may be 30 minutes before he eats. Pt states at work he takes Novolog right before he eats. Pt reports hard spots developing where he injects, RD taught proper injection techniques. Pt likely not getting full dose of insulin and RD stressed importance of SMBG as he will like start seeing reductions in BG with full dose. Pt stated over the last couple of weeks his BGs have been coming down. FBG 130-140, although getting close to range probably not likely to have hypoglycemic risk with appropriate injection techniques.  Pt states he enjoys rice and will usually have it or potatoes with meals. Pt states he drinks soda ~3x week.  Pt states no time for breakfast before he starts work at 7 am; first meal might be lunch at 12 pm, might have chips out of vending machine before.  Sleep: only 4-5 hrs d/t habit from [employer]. Pt states he will be tested for sleep apnea.  Diabetes Self-Management Education - 05/22/19 0820      Visit Information   Visit Type  First/Initial      Initial Visit   Diabetes Type  Type 2    Are you currently following a meal plan?  No    Are you taking your medications as prescribed?  Yes   Novolog 10 u, Lantus 50 u bid, Jardiance 12.5 mg   Date Diagnosed  20000      Health Coping   How would you rate your overall health?  Fair      Psychosocial Assessment   Patient Belief/Attitude about Diabetes  Motivated to manage diabetes    How often do you need to have someone help you when you read instructions, pamphlets, or other written  materials from your doctor or pharmacy?  1 - Never    What is the last grade level you completed in school?  bachelors      Complications   Last HgB A1C per patient/outside source  9.2 %   per patient after hospital d/c 03/2019   How often do you check your blood sugar?  1-2 times/day    Fasting Blood glucose range (mg/dL)  130-179   130-145   Postprandial Blood glucose range (mg/dL)  --   180-190 PPBG 30 min   Number of hypoglycemic episodes per month  0    Have you had a dilated eye exam in the past 12 months?  Yes    Have you had a dental exam in the past 12 months?  No    Are you checking your feet?  Yes    How many days per week are you checking your feet?  7      Dietary Intake   Breakfast  days off will eat rice and eggs/ when working doesn't eat    Snack (morning)  chips    Lunch  shells with meat sauce OR chicken, rice or potatoes, green beans    Snack (afternoon)  pecans all day    Dinner  shells with meat sauce OR chicken, rice or potatoes, green  beans    Snack (evening)  popcorn    Beverage(s)  water, 12 oz sprite 3x week      Exercise   Exercise Type  Light (walking / raking leaves)    How many days per week to you exercise?  3    How many minutes per day do you exercise?  40    Total minutes per week of exercise  120      Patient Education   Previous Diabetes Education  No    Disease state   Definition of diabetes, type 1 and 2, and the diagnosis of diabetes    Nutrition management   Role of diet in the treatment of diabetes and the relationship between the three main macronutrients and blood glucose level;Meal timing in regards to the patients' current diabetes medication.;Reviewed blood glucose goals for pre and post meals and how to evaluate the patients' food intake on their blood glucose level.    Physical activity and exercise   Role of exercise on diabetes management, blood pressure control and cardiac health.    Medications  Taught/reviewed insulin  injection, site rotation, insulin storage and needle disposal.;Reviewed patients medication for diabetes, action, purpose, timing of dose and side effects.    Monitoring  Identified appropriate SMBG and/or A1C goals.      Individualized Goals (developed by patient)   Nutrition  General guidelines for healthy choices and portions discussed    Physical Activity  Exercise 3-5 times per week    Monitoring   test my blood glucose as discussed      Outcomes   Expected Outcomes  Demonstrated interest in learning. Expect positive outcomes    Future DMSE  4-6 wks    Program Status  Completed       Individualized Plan for Diabetes Self-Management Training:   Learning Objective:  Patient will have a greater understanding of diabetes self-management. Patient education plan is to attend individual and/or group sessions per assessed needs and concerns.   Patient Instructions  Read the instruction with your insulin to see how long you are able to leave out of refrigeration after opening. Rotate injection sites and leave needle in for count of 5 Inject Novolog closer to 15 min before meals.  Aim to eat balanced meals and snacks and try some of the strategies for eating less pasta and rice.  Consider eating slow and mindfully to enjoy the smaller serving sizes of carbs Consider taking vegetables to work for snacks instead of chips. Consider having less soda.  Follow up with your plans to investigate sleep apnea   Expected Outcomes:  Demonstrated interest in learning. Expect positive outcomes  Education material provided: ADA - How to Thrive: A Guide for Your Journey with Diabetes, A1C conversion sheet and Carbohydrate counting sheet  If problems or questions, patient to contact team via:  Phone and MyChart  Future DSME appointment: 4-6 wks

## 2019-07-03 ENCOUNTER — Encounter: Payer: Federal, State, Local not specified - PPO | Admitting: Registered"

## 2019-11-25 ENCOUNTER — Ambulatory Visit (HOSPITAL_COMMUNITY)
Admission: EM | Admit: 2019-11-25 | Discharge: 2019-11-25 | Disposition: A | Discharge status: Home / Self Care | Payer: Federal, State, Local not specified - PPO | Attending: Emergency Medicine | Admitting: Emergency Medicine

## 2019-11-25 ENCOUNTER — Other Ambulatory Visit: Payer: Self-pay

## 2019-11-25 ENCOUNTER — Encounter (HOSPITAL_COMMUNITY): Payer: Self-pay

## 2019-11-25 DIAGNOSIS — J029 Acute pharyngitis, unspecified: Secondary | ICD-10-CM

## 2019-11-25 DIAGNOSIS — J069 Acute upper respiratory infection, unspecified: Secondary | ICD-10-CM | POA: Insufficient documentation

## 2019-11-25 LAB — POCT RAPID STREP A, ED / UC: Streptococcus, Group A Screen (Direct): NEGATIVE

## 2019-11-25 MED ORDER — FLUTICASONE PROPIONATE 50 MCG/ACT NA SUSP: 1.0000 | Freq: Every day | NASAL | 0 refills | Status: DC

## 2019-11-25 MED ORDER — CETIRIZINE HCL 10 MG PO CAPS: 10.0000 mg | ORAL_CAPSULE | Freq: Every day | ORAL | 0 refills | Status: DC

## 2019-11-25 NOTE — ED Triage Notes (Signed)
Pt presents with sore throat since Saturday.  

## 2019-11-25 NOTE — Discharge Instructions (Signed)
Sore Throat  Strep test is negative, less likely viral related to postnasal drainage  Please continue Tylenol or Ibuprofen for fever and pain. May try salt water gargles, cepacol lozenges, throat spray, or OTC cold relief medicine for throat discomfort. If you also have congestion take a daily anti-histamine like Zyrtec, Claritin, and a oral decongestant to help with post nasal drip that may be irritating your throat.   Stay hydrated and drink plenty of fluids to keep your throat coated relieve irritation.

## 2019-11-25 NOTE — ED Provider Notes (Signed)
[facility]    CSN: [id number] Arrival date & time: 11/25/19  2836      History   Chief Complaint Chief Complaint  Patient presents with  . Sore Throat    HPI Javier Suarez is a 49 y.o. male history of hypertension, DM type II, presenting today for evaluation of sore throat.  Patient reports began to develop sore throat and runny nose beginning Saturday.  Have persisted for the past 4 to 5 days.  Denies fevers.  Has felt slightly achy.  Denies any cough chest pain or shortness of breath.  Denies GI symptoms.  He previously had Covid at the end of February as well as since has been vaccinated with 2 doses.   HPI  Past Medical History:  Diagnosis Date  . Diabetes mellitus without complication (HCC)   . Gallstones   . Hypertension     Patient Active Problem List   Diagnosis Date Noted  . Type 2 diabetes mellitus with stage 3 chronic kidney disease (HCC) 03/14/2019  . Uncontrolled type 2 diabetes mellitus with hyperglycemia, with long-term current use of insulin (HCC) 03/14/2019  . Uncontrolled hypertension 03/14/2019  . Panniculitis   . Abdominal wall abscess 03/13/2019  . Diabetes (HCC) 03/13/2019  . Benign essential HTN 03/13/2019  . Bilateral carpal tunnel syndrome 09/19/2017    Past Surgical History:  Procedure Laterality Date  . CARDIAC SURGERY         Home Medications    Prior to Admission medications   Medication Sig Start Date End Date Taking? Authorizing Provider  aspirin EC 81 MG tablet Take 1 tablet by mouth daily.    [provider]  atorvastatin (LIPITOR) 20 MG tablet Take 20 mg by mouth daily. 02/28/19   [provider]  Cetirizine HCl 10 MG CAPS Take 1 capsule (10 mg total) by mouth daily. 11/25/19   Ulys Favia C, PA-C  empagliflozin (JARDIANCE) 25 MG TABS tablet Take 12.5 mg by mouth daily. Pt states he was instructed to break pill in half.    [provider]  fluticasone (FLONASE) 50 MCG/ACT nasal spray  Place 1-2 sprays into both nostrils daily for 7 days. 11/25/19 12/02/19  Willisha Sligar C, PA-C  insulin aspart (NOVOLOG) 100 UNIT/ML injection Inject 5 Units into the skin 3 (three) times daily before meals. 03/16/19   Hughie Closs, MD  insulin aspart (NOVOLOG) 100 UNIT/ML injection Inject 15 Units into the skin 3 (three) times daily before meals. Patient taking differently: Inject 10 Units into the skin 3 (three) times daily before meals. [facility] decreased from 15 to 10 units Jan 2021 03/16/19   Hughie Closs, MD  insulin glargine (LANTUS) 100 UNIT/ML injection Inject 0.5 mLs (50 Units total) into the skin 2 (two) times daily. 03/16/19   Hughie Closs, MD  lisinopril-hydrochlorothiazide (ZESTORETIC) 20-12.5 MG tablet Take 1 tablet by mouth 2 (two) times daily. Patient taking differently: Take 1 tablet by mouth 2 (two) times daily.  03/16/19 04/15/19  Hughie Closs, MD  metoprolol tartrate (LOPRESSOR) 50 MG tablet Take 50 mg by mouth 2 (two) times daily.  09/19/18   [provider]  nitroGLYCERIN (NITROSTAT) 0.4 MG SL tablet Place 1 tablet (0.4 mg total) under the tongue every 5 (five) minutes as needed for chest pain (up to 3 doses). 01/07/19   Terrilee Files, MD  Polyethyl Glycol-Propyl Glycol (SYSTANE) 0.4-0.3 % GEL ophthalmic gel Place 1 application into the right eye at bedtime. 02/09/18   Eustace Moore,  MD  VITAMIN D, CHOLECALCIFEROL, PO Take by mouth.    [provider]    Family History Family History  Problem Relation Age of Onset  . Diabetes Mother   . Heart failure Father     Social History Social History   Tobacco Use  . Smoking status: Never Smoker  . Smokeless tobacco: Never Used  Vaping Use  . Vaping Use: Never used  Substance Use Topics  . Alcohol use: No  . Drug use: No     Allergies   Food, Metformin and related, and Metformin   Review of Systems Review of Systems  Constitutional: Negative for activity change, appetite  change, chills, fatigue and fever.  HENT: Positive for sore throat. Negative for congestion, ear pain, rhinorrhea, sinus pressure and trouble swallowing.   Eyes: Negative for discharge and redness.  Respiratory: Negative for cough, chest tightness and shortness of breath.   Cardiovascular: Negative for chest pain.  Gastrointestinal: Negative for abdominal pain, diarrhea, nausea and vomiting.  Musculoskeletal: Negative for myalgias.  Skin: Negative for rash.  Neurological: Negative for dizziness, light-headedness and headaches.     Physical Exam Triage Vital Signs ED Triage Vitals  Enc Vitals Group     BP 11/25/19 0916 (!) 154/100     Pulse Rate 11/25/19 0916 84     Resp 11/25/19 0916 18     Temp 11/25/19 0916 98.6 F (37 C)     Temp Source 11/25/19 0916 Oral     SpO2 11/25/19 0916 97 %     Weight --      Height --      Head Circumference --      Peak Flow --      Pain Score 11/25/19 0917 7     Pain Loc --      Pain Edu? --      Excl. in GC? --    No data found.  Updated Vital Signs BP (!) 154/100 (BP Location: Right Arm) Comment: did not take blood pressure medication today  Pulse 84   Temp 98.6 F (37 C) (Oral)   Resp 18   SpO2 97%   Visual Acuity Right Eye Distance:   Left Eye Distance:   Bilateral Distance:    Right Eye Near:   Left Eye Near:    Bilateral Near:     Physical Exam Vitals and nursing note reviewed.  Constitutional:      Appearance: He is well-developed.     Comments: No acute distress  HENT:     Head: Normocephalic and atraumatic.     Ears:     Comments: Bilateral ears without tenderness to palpation of external auricle, tragus and mastoid, EAC's without erythema or swelling, TM's with good bony landmarks and cone of light. Non erythematous.     Nose: Nose normal.     Mouth/Throat:     Comments: Oral mucosa pink and moist, no tonsillar enlargement or exudate. Posterior pharynx patent and nonerythematous, no uvula deviation or swelling.  Normal phonation. Eyes:     Conjunctiva/sclera: Conjunctivae normal.  Cardiovascular:     Rate and Rhythm: Normal rate.  Pulmonary:     Effort: Pulmonary effort is normal. No respiratory distress.     Comments: Breathing comfortably at rest, CTABL, no wheezing, rales or other adventitious sounds auscultated Abdominal:     General: There is no distension.  Musculoskeletal:        General: Normal range of motion.     Cervical back: Neck  supple.  Skin:    General: Skin is warm and dry.  Neurological:     Mental Status: He is alert and oriented to person, place, and time.      UC Treatments / Results  Labs (all labs ordered are listed, but only abnormal results are displayed) Labs Reviewed  CULTURE, GROUP A STREP [facility])  POCT RAPID STREP A, ED / UC    EKG   Radiology No results found.  Procedures Procedures (including critical care time)  Medications Ordered in UC Medications - No data to display  Initial Impression / Assessment and Plan / UC Course  I have reviewed the triage vital signs and the nursing notes.  Pertinent labs & imaging results that were available during my care of the patient were reviewed by me and considered in my medical decision making (see chart for details).     Strep test negative, exam unremarkable, suspect likely viral etiology or postnasal drainage.  Recommending symptomatic and supportive care with continued close monitoring.  Rest and fluids.  Discussed strict return precautions. Patient verbalized understanding and is agreeable with plan.  Final Clinical Impressions(s) / UC Diagnoses   Final diagnoses:  Sore throat  Viral URI     Discharge Instructions     Sore Throat  Strep test is negative, less likely viral related to postnasal drainage  Please continue Tylenol or Ibuprofen for fever and pain. May try salt water gargles, cepacol lozenges, throat spray, or OTC cold relief medicine for throat discomfort. If you also have  congestion take a daily anti-histamine like Zyrtec, Claritin, and a oral decongestant to help with post nasal drip that may be irritating your throat.   Stay hydrated and drink plenty of fluids to keep your throat coated relieve irritation.     ED Prescriptions    Medication Sig Dispense Auth. Provider   Cetirizine HCl 10 MG CAPS Take 1 capsule (10 mg total) by mouth daily. 15 capsule Alyxander Kollmann C, PA-C   fluticasone (FLONASE) 50 MCG/ACT nasal spray Place 1-2 sprays into both nostrils daily for 7 days. 1 g Conny Moening, [place] C, PA-C     PDMP not reviewed this encounter.   Lew Dawes, [place] 11/25/19 [phone]

## 2019-11-27 LAB — CULTURE, GROUP A STREP (THRC)

## 2020-06-17 ENCOUNTER — Other Ambulatory Visit: Payer: Self-pay

## 2020-06-17 ENCOUNTER — Ambulatory Visit (HOSPITAL_COMMUNITY)
Admission: EM | Admit: 2020-06-17 | Discharge: 2020-06-17 | Disposition: A | Discharge status: Home / Self Care | Payer: Federal, State, Local not specified - PPO | Attending: Emergency Medicine | Admitting: Emergency Medicine

## 2020-06-17 ENCOUNTER — Encounter (HOSPITAL_COMMUNITY): Payer: Self-pay

## 2020-06-17 DIAGNOSIS — T50Z95A Adverse effect of other vaccines and biological substances, initial encounter: Secondary | ICD-10-CM | POA: Diagnosis not present

## 2020-06-17 DIAGNOSIS — R531 Weakness: Secondary | ICD-10-CM | POA: Insufficient documentation

## 2020-06-17 DIAGNOSIS — R6883 Chills (without fever): Secondary | ICD-10-CM | POA: Insufficient documentation

## 2020-06-17 DIAGNOSIS — R5383 Other fatigue: Secondary | ICD-10-CM | POA: Diagnosis not present

## 2020-06-17 DIAGNOSIS — Z20822 Contact with and (suspected) exposure to covid-19: Secondary | ICD-10-CM | POA: Insufficient documentation

## 2020-06-17 DIAGNOSIS — R52 Pain, unspecified: Secondary | ICD-10-CM | POA: Diagnosis not present

## 2020-06-17 DIAGNOSIS — R059 Cough, unspecified: Secondary | ICD-10-CM | POA: Diagnosis not present

## 2020-06-17 LAB — SARS CORONAVIRUS 2 (TAT 6-24 HRS): SARS Coronavirus 2: NEGATIVE

## 2020-06-17 MED ORDER — ACETAMINOPHEN 325 MG PO TABS
975.0000 mg | ORAL_TABLET | Freq: Once | ORAL | Status: AC
  Administered 2020-06-17: 975 mg via ORAL

## 2020-06-17 MED ORDER — ACETAMINOPHEN 325 MG PO TABS
ORAL_TABLET | ORAL | Status: AC
  Filled 2020-06-17: qty 3

## 2020-06-17 NOTE — Discharge Instructions (Signed)
Body aches, fever and fatigue can occur, particularly after booster vaccination.  Rest, plenty of fluids, tylenol or ibuprofen as needed.  We will test to ensure you did not coincidentally get exposed to covid-19, vaccine will not cause a false positive.  Symptoms should resolve in the next 24 hours.  I have attached the vaccine information specific to [employer].

## 2020-06-17 NOTE — ED Triage Notes (Signed)
Pt presents with slight non productive cough, generalized body aches, fatigue, weakness, and chills 2 days post covid booster shot.

## 2020-06-17 NOTE — ED Provider Notes (Signed)
[facility]    CSN: [id number] Arrival date & time: 06/17/20  0802      History   Chief Complaint Chief Complaint  Patient presents with  . Chills  . Cough  . Generalized Body Aches  . Fatigue  . Weakness    HPI Javier Suarez is a 50 y.o. male.   Javier Suarez presents with complaints of body aches, night sweats, cough, fatigue, which started two days ago following his [employer] covid-19 booster shot. He has had covid in the past as well. States he feels similar to when he has had covid in the past. No shortness of breath or work of breathing. No gi symptoms but has had decreased appetite. No known ill contacts but does work at the [employer]. Hasn't taken any medications for symptoms.     ROS per HPI, negative if not otherwise mentioned.      Past Medical History:  Diagnosis Date  . Diabetes mellitus without complication (HCC)   . Gallstones   . Hypertension     Patient Active Problem List   Diagnosis Date Noted  . Type 2 diabetes mellitus with stage 3 chronic kidney disease (HCC) 03/14/2019  . Uncontrolled type 2 diabetes mellitus with hyperglycemia, with long-term current use of insulin (HCC) 03/14/2019  . Uncontrolled hypertension 03/14/2019  . Panniculitis   . Abdominal wall abscess 03/13/2019  . Diabetes (HCC) 03/13/2019  . Benign essential HTN 03/13/2019  . Bilateral carpal tunnel syndrome 09/19/2017    Past Surgical History:  Procedure Laterality Date  . CARDIAC SURGERY         Home Medications    Prior to Admission medications   Medication Sig Start Date End Date Taking? Authorizing Provider  aspirin EC 81 MG tablet Take 1 tablet by mouth daily.    [provider]  atorvastatin (LIPITOR) 20 MG tablet Take 20 mg by mouth daily. 02/28/19   [provider]  Cetirizine HCl 10 MG CAPS Take 1 capsule (10 mg total) by mouth daily. 11/25/19   Wieters, Hallie C, PA-C  empagliflozin (JARDIANCE) 25 MG TABS tablet Take  12.5 mg by mouth daily. Pt states he was instructed to break pill in half.    [provider]  fluticasone (FLONASE) 50 MCG/ACT nasal spray Place 1-2 sprays into both nostrils daily for 7 days. 11/25/19 12/02/19  Wieters, Hallie C, PA-C  insulin aspart (NOVOLOG) 100 UNIT/ML injection Inject 5 Units into the skin 3 (three) times daily before meals. 03/16/19   Hughie Closs, MD  insulin aspart (NOVOLOG) 100 UNIT/ML injection Inject 15 Units into the skin 3 (three) times daily before meals. Patient taking differently: Inject 10 Units into the skin 3 (three) times daily before meals. [facility] decreased from 15 to 10 units Jan 2021 03/16/19   Hughie Closs, MD  insulin glargine (LANTUS) 100 UNIT/ML injection Inject 0.5 mLs (50 Units total) into the skin 2 (two) times daily. 03/16/19   Hughie Closs, MD  lisinopril-hydrochlorothiazide (ZESTORETIC) 20-12.5 MG tablet Take 1 tablet by mouth 2 (two) times daily. Patient taking differently: Take 1 tablet by mouth 2 (two) times daily.  03/16/19 04/15/19  Hughie Closs, MD  metoprolol tartrate (LOPRESSOR) 50 MG tablet Take 50 mg by mouth 2 (two) times daily.  09/19/18   [provider]  nitroGLYCERIN (NITROSTAT) 0.4 MG SL tablet Place 1 tablet (0.4 mg total) under the tongue every 5 (five) minutes as needed for chest pain (up to 3 doses). 01/07/19  Terrilee Files, MD  Polyethyl Glycol-Propyl Glycol (SYSTANE) 0.4-0.3 % GEL ophthalmic gel Place 1 application into the right eye at bedtime. 02/09/18   Eustace Moore, MD  VITAMIN D, CHOLECALCIFEROL, PO Take by mouth.    [provider]    Family History Family History  Problem Relation Age of Onset  . Diabetes Mother   . Heart failure Father     Social History Social History   Tobacco Use  . Smoking status: Never Smoker  . Smokeless tobacco: Never Used  Vaping Use  . Vaping Use: Never used  Substance Use Topics  . Alcohol use: No  . Drug use: No     Allergies    Food, Metformin and related, and Metformin   Review of Systems Review of Systems   Physical Exam Triage Vital Signs ED Triage Vitals  Enc Vitals Group     BP 06/17/20 0828 (!) 137/94     Pulse Rate 06/17/20 0828 65     Resp 06/17/20 0828 20     Temp 06/17/20 0828 98.3 F (36.8 C)     Temp Source 06/17/20 0828 Oral     SpO2 06/17/20 0828 100 %     Weight --      Height --      Head Circumference --      Peak Flow --      Pain Score 06/17/20 0826 7     Pain Loc --      Pain Edu? --      Excl. in GC? --    No data found.  Updated Vital Signs BP (!) 137/94 (BP Location: Right Arm)   Pulse 65   Temp 98.3 F (36.8 C) (Oral)   Resp 20   SpO2 100%   Visual Acuity Right Eye Distance:   Left Eye Distance:   Bilateral Distance:    Right Eye Near:   Left Eye Near:    Bilateral Near:     Physical Exam Constitutional:      Appearance: He is well-developed.  Cardiovascular:     Rate and Rhythm: Normal rate.  Pulmonary:     Effort: Pulmonary effort is normal.  Skin:    General: Skin is warm and dry.  Neurological:     Mental Status: He is alert and oriented to person, place, and time.      UC Treatments / Results  Labs (all labs ordered are listed, but only abnormal results are displayed) Labs Reviewed  SARS CORONAVIRUS 2 (TAT 6-24 HRS)    EKG   Radiology No results found.  Procedures Procedures (including critical care time)  Medications Ordered in UC Medications  acetaminophen (TYLENOL) tablet 975 mg (has no administration in time range)    Initial Impression / Assessment and Plan / UC Course  I have reviewed the triage vital signs and the nursing notes.  Pertinent labs & imaging results that were available during my care of the patient were reviewed by me and considered in my medical decision making (see chart for details).     Non toxic. Benign physical exam. Likely related to booster and should resolve in the next 24-48 hours. Supportive  cares recommended. Will test for covid-19 as well to ensure he was not coincidentally exposed and now positive. Return precautions provided. Patient verbalized understanding and agreeable to plan.   Final Clinical Impressions(s) / UC Diagnoses   Final diagnoses:  Body aches  Cough     Discharge Instructions  Body aches, fever and fatigue can occur, particularly after booster vaccination.  Rest, plenty of fluids, tylenol or ibuprofen as needed.  We will test to ensure you did not coincidentally get exposed to covid-19, vaccine will not cause a false positive.  Symptoms should resolve in the next 24 hours.  I have attached the vaccine information specific to [employer].     ED Prescriptions    None     PDMP not reviewed this encounter.   Georgetta Haber, NP 06/17/20 [phone]

## 2020-08-25 ENCOUNTER — Other Ambulatory Visit: Payer: Self-pay

## 2020-08-25 ENCOUNTER — Ambulatory Visit (INDEPENDENT_AMBULATORY_CARE_PROVIDER_SITE_OTHER): Payer: No Typology Code available for payment source

## 2020-08-25 ENCOUNTER — Ambulatory Visit (HOSPITAL_COMMUNITY)
Admission: EM | Admit: 2020-08-25 | Discharge: 2020-08-25 | Disposition: A | Discharge status: Home / Self Care | Payer: No Typology Code available for payment source | Attending: Physician Assistant | Admitting: Physician Assistant

## 2020-08-25 ENCOUNTER — Encounter (HOSPITAL_COMMUNITY): Payer: Self-pay

## 2020-08-25 DIAGNOSIS — M5451 Vertebrogenic low back pain: Secondary | ICD-10-CM | POA: Diagnosis not present

## 2020-08-25 DIAGNOSIS — M545 Low back pain, unspecified: Secondary | ICD-10-CM

## 2020-08-25 MED ORDER — NAPROXEN 375 MG PO TABS: 375.0000 mg | ORAL_TABLET | Freq: Two times a day (BID) | ORAL | 0 refills | Status: AC

## 2020-08-25 MED ORDER — KETOROLAC TROMETHAMINE 60 MG/2ML IM SOLN
60.0000 mg | Freq: Once | INTRAMUSCULAR | Status: AC
  Administered 2020-08-25: 60 mg via INTRAMUSCULAR

## 2020-08-25 MED ORDER — METHOCARBAMOL 500 MG PO TABS: 500.0000 mg | ORAL_TABLET | Freq: Three times a day (TID) | ORAL | 0 refills | Status: DC

## 2020-08-25 MED ORDER — KETOROLAC TROMETHAMINE 60 MG/2ML IM SOLN
INTRAMUSCULAR | Status: AC
  Filled 2020-08-25: qty 2

## 2020-08-25 NOTE — Discharge Instructions (Signed)
Your x-ray was normal.  We have given you Toradol which is like ibuprofen so please do not take any ibuprofen/Advil, naproxen/Aleve, aspirin for the rest of today.  I have called in Naprosyn that you can start tomorrow which will help with inflammation and pain.  This is similar to ibuprofen so you should not take additional NSAIDs (ibuprofen/Advil, naproxen/Aleve, aspirin with the exception of baby aspirin) with this medication.  You were prescribed methocarbamol which is a muscle relaxer.  You can take this 3 times a day but this will make you sleepy so do not drive or drink alcohol while taking it.  Use heat and rest for additional symptom relief.  I would follow-up with your primary care provider as if you continue to have symptoms that you may want to see a physical therapist.  If you have any worsening symptoms please return for further evaluation.

## 2020-08-25 NOTE — ED Provider Notes (Signed)
[facility]    CSN: [id number] Arrival date & time: 08/25/20  3354      History   Chief Complaint No chief complaint on file.   HPI Javier Suarez is a 50 y.o. male.   Patient presents today with a several day history of lower back pain.  Reports that he was working on his wife's car when he bent down and felt a sudden pain in his lower back.  He denies additional injury or change in activity prior to symptom onset.  He has been taking ibuprofen without improvement of symptoms.  Reports pain is rated 9 on a 0-10 pain scale, localized to lumbar paraspinal muscles, described as pressure/aching, worse with bending forward, no alleviating factors identified.  Patient reports injuring his back when he was much younger in the [employer] but has not had any ongoing pain related to this.  Denies previous spinal surgery.  Denies blood thinning medications or history of malignancy.  He denies any radiation of pain into lower extremities, lower extremity weakness, saddle anesthesia, bowel/bladder incontinence.     Past Medical History:  Diagnosis Date  . Diabetes mellitus without complication (HCC)   . Gallstones   . Hypertension     Patient Active Problem List   Diagnosis Date Noted  . Type 2 diabetes mellitus with stage 3 chronic kidney disease (HCC) 03/14/2019  . Uncontrolled type 2 diabetes mellitus with hyperglycemia, with long-term current use of insulin (HCC) 03/14/2019  . Uncontrolled hypertension 03/14/2019  . Panniculitis   . Abdominal wall abscess 03/13/2019  . Diabetes (HCC) 03/13/2019  . Benign essential HTN 03/13/2019  . Bilateral carpal tunnel syndrome 09/19/2017    Past Surgical History:  Procedure Laterality Date  . CARDIAC SURGERY         Home Medications    Prior to Admission medications   Medication Sig Start Date End Date Taking? Authorizing Provider  methocarbamol (ROBAXIN) 500 MG tablet Take 1 tablet (500 mg total) by mouth 3 (three) times  daily. 08/25/20  Yes Kyna Blahnik K, PA-C  naproxen (NAPROSYN) 375 MG tablet Take 1 tablet (375 mg total) by mouth 2 (two) times daily. 08/25/20  Yes Amor Hyle, Noberto Retort, PA-C  aspirin EC 81 MG tablet Take 1 tablet by mouth daily.    [provider]  atorvastatin (LIPITOR) 20 MG tablet Take 20 mg by mouth daily. 02/28/19   [provider]  Cetirizine HCl 10 MG CAPS Take 1 capsule (10 mg total) by mouth daily. 11/25/19   Wieters, Hallie C, PA-C  empagliflozin (JARDIANCE) 25 MG TABS tablet Take 12.5 mg by mouth daily. Pt states he was instructed to break pill in half.    [provider]  fluticasone (FLONASE) 50 MCG/ACT nasal spray Place 1-2 sprays into both nostrils daily for 7 days. 11/25/19 12/02/19  Wieters, Hallie C, PA-C  insulin aspart (NOVOLOG) 100 UNIT/ML injection Inject 5 Units into the skin 3 (three) times daily before meals. 03/16/19   Hughie Closs, MD  insulin aspart (NOVOLOG) 100 UNIT/ML injection Inject 15 Units into the skin 3 (three) times daily before meals. Patient taking differently: Inject 10 Units into the skin 3 (three) times daily before meals. [facility] decreased from 15 to 10 units Jan 2021 03/16/19   Hughie Closs, MD  insulin glargine (LANTUS) 100 UNIT/ML injection Inject 0.5 mLs (50 Units total) into the skin 2 (two) times daily. 03/16/19   Hughie Closs, MD  lisinopril-hydrochlorothiazide (ZESTORETIC) 20-12.5 MG tablet Take 1 tablet by  mouth 2 (two) times daily. Patient taking differently: Take 1 tablet by mouth 2 (two) times daily.  03/16/19 04/15/19  Hughie Closs, MD  metoprolol tartrate (LOPRESSOR) 50 MG tablet Take 50 mg by mouth 2 (two) times daily.  09/19/18   [provider]  nitroGLYCERIN (NITROSTAT) 0.4 MG SL tablet Place 1 tablet (0.4 mg total) under the tongue every 5 (five) minutes as needed for chest pain (up to 3 doses). 01/07/19   Terrilee Files, MD  Polyethyl Glycol-Propyl Glycol (SYSTANE) 0.4-0.3 % GEL ophthalmic gel  Place 1 application into the right eye at bedtime. 02/09/18   Eustace Moore, MD  VITAMIN D, CHOLECALCIFEROL, PO Take by mouth.    [provider]    Family History Family History  Problem Relation Age of Onset  . Diabetes Mother   . Heart failure Father     Social History Social History   Tobacco Use  . Smoking status: Never Smoker  . Smokeless tobacco: Never Used  Vaping Use  . Vaping Use: Never used  Substance Use Topics  . Alcohol use: No  . Drug use: No     Allergies   Food, Glipizide, Metformin and related, and Metformin   Review of Systems Review of Systems  Constitutional: Positive for activity change. Negative for appetite change, fatigue and fever.  Respiratory: Negative for cough and shortness of breath.   Cardiovascular: Negative for chest pain.  Gastrointestinal: Negative for abdominal pain, diarrhea, nausea and vomiting.  Musculoskeletal: Positive for back pain. Negative for arthralgias and myalgias.  Neurological: Negative for dizziness, weakness, light-headedness, numbness and headaches.     Physical Exam Triage Vital Signs ED Triage Vitals  Enc Vitals Group     BP 08/25/20 0820 131/84     Pulse Rate 08/25/20 0820 71     Resp 08/25/20 0820 18     Temp 08/25/20 0820 98.7 F (37.1 C)     Temp Source 08/25/20 0820 Oral     SpO2 08/25/20 0820 100 %     Weight --      Height --      Head Circumference --      Peak Flow --      Pain Score 08/25/20 0818 9     Pain Loc --      Pain Edu? --      Excl. in GC? --    No data found.  Updated Vital Signs BP 131/84 (BP Location: Left Arm)   Pulse 71   Temp 98.7 F (37.1 C) (Oral)   Resp 18   SpO2 100%   Visual Acuity Right Eye Distance:   Left Eye Distance:   Bilateral Distance:    Right Eye Near:   Left Eye Near:    Bilateral Near:     Physical Exam Vitals reviewed.  Constitutional:      General: He is awake.     Appearance: Normal appearance. He is normal weight. He is  not ill-appearing.     Comments: Very pleasant male appears stated age in no acute distress  HENT:     Head: Normocephalic and atraumatic.     Mouth/Throat:     Pharynx: No oropharyngeal exudate or posterior oropharyngeal erythema.  Cardiovascular:     Rate and Rhythm: Normal rate and regular rhythm.     Heart sounds: No murmur heard.   Pulmonary:     Effort: Pulmonary effort is normal.     Breath sounds: Normal breath sounds. No stridor.  No wheezing, rhonchi or rales.     Comments: Clear to auscultation bilaterally Abdominal:     General: Bowel sounds are normal.     Palpations: Abdomen is soft.     Tenderness: There is no abdominal tenderness.  Musculoskeletal:     Cervical back: No tenderness or bony tenderness.     Thoracic back: No tenderness or bony tenderness.     Lumbar back: Spasms, tenderness and bony tenderness present. Decreased range of motion.     Comments: Back: Pain on percussion of lumbar vertebrae.  Tenderness palpation of bilateral lumbar paraspinal muscles.  Spasm noted lumbar left paraspinal muscles.  Patient unable to lie flat for straight leg raise.  Neurological:     Mental Status: He is alert.  Psychiatric:        Behavior: Behavior is cooperative.      UC Treatments / Results  Labs (all labs ordered are listed, but only abnormal results are displayed) Labs Reviewed - No data to display  EKG   Radiology DG Lumbar Spine Complete  Result Date: 08/25/2020 CLINICAL DATA:  Pain with percussion or vertebrae EXAM: LUMBAR SPINE - COMPLETE 4+ VIEW COMPARISON:  Abdominal CT 03/13/2019 FINDINGS: No evidence of fracture or bone lesion. Lower lumbar degenerative endplate spurring. L5-S1 mild disc space narrowing. IMPRESSION: 1. No acute or focal finding. 2. L4-5 and L5-S1 degenerative endplate spurring. Electronically Signed   By: Marnee Spring M.D.   On: 08/25/2020 09:34    Procedures Procedures (including critical care time)  Medications Ordered in  UC Medications  ketorolac (TORADOL) injection 60 mg (60 mg Intramuscular Given 08/25/20 0855)    Initial Impression / Assessment and Plan / UC Course  I have reviewed the triage vital signs and the nursing notes.  Pertinent labs & imaging results that were available during my care of the patient were reviewed by me and considered in my medical decision making (see chart for details).     X-ray showed no acute findings but did show degenerative changes.  Patient was given Toradol with improvement but not resolution of symptoms.  He was prescribed Robaxin with instruction not to drive or drink alcohol with this medication.  He was given prescription for Naprosyn with instruction not to take additional NSAIDs.  Discussed that if symptoms persist he may need to follow-up with physical therapy and/or consider MRI which would need to be arranged through PCP.  Discussed alarm symptoms that warrant emergent evaluation.  Strict return precautions given to which patient expressed understanding.  Final Clinical Impressions(s) / UC Diagnoses   Final diagnoses:  Acute bilateral low back pain without sciatica     Discharge Instructions     Your x-ray was normal.  We have given you Toradol which is like ibuprofen so please do not take any ibuprofen/Advil, naproxen/Aleve, aspirin for the rest of today.  I have called in Naprosyn that you can start tomorrow which will help with inflammation and pain.  This is similar to ibuprofen so you should not take additional NSAIDs (ibuprofen/Advil, naproxen/Aleve, aspirin with the exception of baby aspirin) with this medication.  You were prescribed methocarbamol which is a muscle relaxer.  You can take this 3 times a day but this will make you sleepy so do not drive or drink alcohol while taking it.  Use heat and rest for additional symptom relief.  I would follow-up with your primary care provider as if you continue to have symptoms that you may want to see a physical  therapist.  If you have any worsening symptoms please return for further evaluation.    ED Prescriptions    Medication Sig Dispense Auth. Provider   methocarbamol (ROBAXIN) 500 MG tablet Take 1 tablet (500 mg total) by mouth 3 (three) times daily. 30 tablet Debbe Crumble K, PA-C   naproxen (NAPROSYN) 375 MG tablet Take 1 tablet (375 mg total) by mouth 2 (two) times daily. 20 tablet Eevee Borbon, Noberto RetortErin K, PA-C     PDMP not reviewed this encounter.   Jeani HawkingRaspet, Merle Whitehorn K, PA-C 08/25/20 [id number]

## 2020-08-25 NOTE — ED Triage Notes (Signed)
Pt c/o back pain x 2 days. 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 Pt states it hurts to bend over.

## 2020-10-19 ENCOUNTER — Encounter (HOSPITAL_COMMUNITY): Payer: Self-pay

## 2020-10-19 ENCOUNTER — Emergency Department (HOSPITAL_BASED_OUTPATIENT_CLINIC_OR_DEPARTMENT_OTHER): Payer: No Typology Code available for payment source

## 2020-10-19 ENCOUNTER — Other Ambulatory Visit: Payer: Self-pay

## 2020-10-19 ENCOUNTER — Encounter (HOSPITAL_BASED_OUTPATIENT_CLINIC_OR_DEPARTMENT_OTHER): Payer: Self-pay | Admitting: *Deleted

## 2020-10-19 ENCOUNTER — Emergency Department (HOSPITAL_BASED_OUTPATIENT_CLINIC_OR_DEPARTMENT_OTHER)
Admission: EM | Admit: 2020-10-19 | Discharge: 2020-10-20 | Disposition: A | Discharge status: Home / Self Care | Payer: No Typology Code available for payment source | Source: Home / Self Care | Attending: Emergency Medicine | Admitting: Emergency Medicine

## 2020-10-19 ENCOUNTER — Ambulatory Visit
Admission: EM | Admit: 2020-10-19 | Discharge: 2020-10-19 | Disposition: A | Discharge status: Other Acute Inpatient Hospital | Payer: Federal, State, Local not specified - PPO

## 2020-10-19 ENCOUNTER — Emergency Department (HOSPITAL_COMMUNITY)
Admission: EM | Admit: 2020-10-19 | Discharge: 2020-10-19 | Disposition: A | Discharge status: Home / Self Care | Payer: No Typology Code available for payment source | Attending: Emergency Medicine | Admitting: Emergency Medicine

## 2020-10-19 ENCOUNTER — Encounter: Payer: Self-pay | Admitting: Emergency Medicine

## 2020-10-19 DIAGNOSIS — D72829 Elevated white blood cell count, unspecified: Secondary | ICD-10-CM | POA: Insufficient documentation

## 2020-10-19 DIAGNOSIS — R590 Localized enlarged lymph nodes: Secondary | ICD-10-CM | POA: Insufficient documentation

## 2020-10-19 DIAGNOSIS — Z794 Long term (current) use of insulin: Secondary | ICD-10-CM | POA: Insufficient documentation

## 2020-10-19 DIAGNOSIS — M793 Panniculitis, unspecified: Secondary | ICD-10-CM

## 2020-10-19 DIAGNOSIS — R109 Unspecified abdominal pain: Secondary | ICD-10-CM

## 2020-10-19 DIAGNOSIS — R1031 Right lower quadrant pain: Secondary | ICD-10-CM | POA: Insufficient documentation

## 2020-10-19 DIAGNOSIS — L03311 Cellulitis of abdominal wall: Secondary | ICD-10-CM | POA: Insufficient documentation

## 2020-10-19 DIAGNOSIS — N183 Chronic kidney disease, stage 3 unspecified: Secondary | ICD-10-CM | POA: Insufficient documentation

## 2020-10-19 DIAGNOSIS — E1122 Type 2 diabetes mellitus with diabetic chronic kidney disease: Secondary | ICD-10-CM | POA: Insufficient documentation

## 2020-10-19 DIAGNOSIS — I129 Hypertensive chronic kidney disease with stage 1 through stage 4 chronic kidney disease, or unspecified chronic kidney disease: Secondary | ICD-10-CM | POA: Insufficient documentation

## 2020-10-19 DIAGNOSIS — Z5321 Procedure and treatment not carried out due to patient leaving prior to being seen by health care provider: Secondary | ICD-10-CM | POA: Diagnosis not present

## 2020-10-19 DIAGNOSIS — R61 Generalized hyperhidrosis: Secondary | ICD-10-CM | POA: Insufficient documentation

## 2020-10-19 DIAGNOSIS — M791 Myalgia, unspecified site: Secondary | ICD-10-CM | POA: Insufficient documentation

## 2020-10-19 DIAGNOSIS — Z7984 Long term (current) use of oral hypoglycemic drugs: Secondary | ICD-10-CM | POA: Insufficient documentation

## 2020-10-19 DIAGNOSIS — Z79899 Other long term (current) drug therapy: Secondary | ICD-10-CM | POA: Insufficient documentation

## 2020-10-19 DIAGNOSIS — Z7982 Long term (current) use of aspirin: Secondary | ICD-10-CM | POA: Insufficient documentation

## 2020-10-19 HISTORY — DX: Cutaneous abscess, unspecified: L02.91

## 2020-10-19 LAB — COMPREHENSIVE METABOLIC PANEL
ALT: 17 U/L (ref 0–44)
AST: 16 U/L (ref 15–41)
Albumin: 3.8 g/dL (ref 3.5–5.0)
Alkaline Phosphatase: 62 U/L (ref 38–126)
Anion gap: 9 (ref 5–15)
BUN: 22 mg/dL — ABNORMAL HIGH (ref 6–20)
CO2: 26 mmol/L (ref 22–32)
Calcium: 9.1 mg/dL (ref 8.9–10.3)
Chloride: 102 mmol/L (ref 98–111)
Creatinine, Ser: 1.69 mg/dL — ABNORMAL HIGH (ref 0.61–1.24)
GFR, Estimated: 50 mL/min — ABNORMAL LOW (ref >60)
Glucose, Bld: 196 mg/dL — ABNORMAL HIGH (ref 70–99)
Potassium: 3.8 mmol/L (ref 3.5–5.1)
Sodium: 137 mmol/L (ref 135–145)
Total Bilirubin: 0.4 mg/dL (ref 0.3–1.2)
Total Protein: 8.4 g/dL — ABNORMAL HIGH (ref 6.5–8.1)

## 2020-10-19 LAB — CBC WITH DIFFERENTIAL/PLATELET
Abs Immature Granulocytes: 0.04 10*3/uL (ref 0.00–0.07)
Basophils Absolute: 0 10*3/uL (ref 0.0–0.1)
Basophils Relative: 0 %
Eosinophils Absolute: 0.2 10*3/uL (ref 0.0–0.5)
Eosinophils Relative: 2 %
HCT: 44.6 % (ref 39.0–52.0)
Hemoglobin: 14.5 g/dL (ref 13.0–17.0)
Immature Granulocytes: 0 %
Lymphocytes Relative: 17 %
Lymphs Abs: 2 10*3/uL (ref 0.7–4.0)
MCH: 28 pg (ref 26.0–34.0)
MCHC: 32.5 g/dL (ref 30.0–36.0)
MCV: 86.1 fL (ref 80.0–100.0)
Monocytes Absolute: 0.7 10*3/uL (ref 0.1–1.0)
Monocytes Relative: 6 %
Neutro Abs: 8.7 10*3/uL — ABNORMAL HIGH (ref 1.7–7.7)
Neutrophils Relative %: 75 %
Platelets: 222 10*3/uL (ref 150–400)
RBC: 5.18 MIL/uL (ref 4.22–5.81)
RDW: 13.7 % (ref 11.5–15.5)
WBC: 11.6 10*3/uL — ABNORMAL HIGH (ref 4.0–10.5)
nRBC: 0 % (ref 0.0–0.2)

## 2020-10-19 MED ORDER — KETOROLAC TROMETHAMINE 15 MG/ML IJ SOLN
15.0000 mg | Freq: Once | INTRAMUSCULAR | Status: AC
  Administered 2020-10-19: 22:00:00 15 mg via INTRAVENOUS
  Filled 2020-10-19: qty 1

## 2020-10-19 MED ORDER — LACTATED RINGERS IV BOLUS
500.0000 mL | Freq: Once | INTRAVENOUS | Status: AC
  Administered 2020-10-19: 500 mL via INTRAVENOUS

## 2020-10-19 MED ORDER — IOHEXOL 300 MG/ML  SOLN
100.0000 mL | Freq: Once | INTRAMUSCULAR | Status: AC | PRN
  Administered 2020-10-19: 23:00:00 100 mL via INTRAVENOUS

## 2020-10-19 NOTE — ED Notes (Signed)
Abcess to lower abdomen x1 week.

## 2020-10-19 NOTE — ED Triage Notes (Signed)
Patient c/o abdominal pain in the fold of skin.   Patient has a hx of cellulitis in same area and had to have it drained per notes in 2021.  10/10 pain  A/ox4 Ambulatory in triage.

## 2020-10-19 NOTE — ED Notes (Signed)
Pt returned his stickers to registration and states that he is leaving d/t wait time.

## 2020-10-19 NOTE — ED Provider Notes (Signed)
Emergency Medicine Provider Triage Evaluation Note  NOELLE HOOGLAND , a 50 y.o. male  was evaluated in triage.  Pt complains of abscess to the superficial lower abdomen and penis area.  This has been ongoing for the past 4 days, does endorse some chills, has been applying topical ointment without improvement.  Prior history of the same but to the opposite side.  Review of Systems  Positive: wound Negative: Fever, nausea, vomiting, constipation  Physical Exam  BP (!) 122/92 (BP Location: Left Arm)   Pulse 85   Temp 98.7 F (37.1 C) (Oral)   Resp 18   SpO2 98%  Gen:   Awake, no distress   Resp:  Normal effort  MSK:   Moves extremities without difficulty  Other:  Small indurated abscess to the right lower abdomen, appears indurated with some fluctuance.  No streaking of the skin.  Medical Decision Making  Medically screening exam initiated at 2:29 PM.  Appropriate orders placed.  [name] was informed that the remainder of the evaluation will be completed by another provider, this initial triage assessment does not replace that evaluation, and the importance of remaining in the ED until their evaluation is complete.     Claude Manges, PA-C 10/19/20 1430    Mancel Bale, MD 10/19/20 [phone]

## 2020-10-19 NOTE — ED Provider Notes (Signed)
[facility]    CSN: [id number] Arrival date & time: 10/19/20  1711      History   Chief Complaint Chief Complaint  Patient presents with   Abscess    HPI Javier Suarez is a 50 y.o. male.   Patient presenting today with daughter for evaluation of right lower abdominal wall pain, firmness progressively worsening for 6 days now.  He endorses chills over the weekend but no documented fever, sweats, body aches and no noted redness or drainage from the area.  The swelling is now extending down into right inguinal area as well.  He denies bowel changes, urinary symptoms, penile discharge, intra-abdominal pain, nausea, vomiting.  So far taking over-the-counter pain relievers and using numbing cream with minimal relief.  States he had an abdominal wall abscess that was similar but more central to his low abdomen almost 2 years ago that had to be drained.  He was triaged in the emergency department at [facility] this evening but left due to the wait time to come here.  He was told at the emergency department that he would likely need a CT scan for further evaluation.  Of note, patient has history of insulin-dependent diabetes.  Past Medical History:  Diagnosis Date   Abscess    Diabetes mellitus without complication (HCC)    Gallstones    Hypertension     Patient Active Problem List   Diagnosis Date Noted   Type 2 diabetes mellitus with stage 3 chronic kidney disease (HCC) 03/14/2019   Uncontrolled type 2 diabetes mellitus with hyperglycemia, with long-term current use of insulin (HCC) 03/14/2019   Uncontrolled hypertension 03/14/2019   Panniculitis    Abdominal wall abscess 03/13/2019   Diabetes (HCC) 03/13/2019   Benign essential HTN 03/13/2019   Bilateral carpal tunnel syndrome 09/19/2017    Past Surgical History:  Procedure Laterality Date   CARDIAC SURGERY         Home Medications    Prior to Admission medications   Medication Sig Start Date End Date  Taking? Authorizing Provider  aspirin EC 81 MG tablet Take 1 tablet by mouth daily.    [provider]  atorvastatin (LIPITOR) 20 MG tablet Take 20 mg by mouth daily. 02/28/19   [provider]  Cetirizine HCl 10 MG CAPS Take 1 capsule (10 mg total) by mouth daily. 11/25/19   Wieters, Hallie C, PA-C  empagliflozin (JARDIANCE) 25 MG TABS tablet Take 12.5 mg by mouth daily. Pt states he was instructed to break pill in half.    [provider]  fluticasone (FLONASE) 50 MCG/ACT nasal spray Place 1-2 sprays into both nostrils daily for 7 days. 11/25/19 12/02/19  Wieters, Hallie C, PA-C  insulin aspart (NOVOLOG) 100 UNIT/ML injection Inject 5 Units into the skin 3 (three) times daily before meals. 03/16/19   Hughie Closs, MD  insulin aspart (NOVOLOG) 100 UNIT/ML injection Inject 15 Units into the skin 3 (three) times daily before meals. Patient taking differently: Inject 10 Units into the skin 3 (three) times daily before meals. [facility] decreased from 15 to 10 units Jan 2021 03/16/19   Hughie Closs, MD  insulin glargine (LANTUS) 100 UNIT/ML injection Inject 0.5 mLs (50 Units total) into the skin 2 (two) times daily. 03/16/19   Hughie Closs, MD  lisinopril-hydrochlorothiazide (ZESTORETIC) 20-12.5 MG tablet Take 1 tablet by mouth 2 (two) times daily. Patient taking differently: Take 1 tablet by mouth 2 (two) times daily.  03/16/19 04/15/19  Hughie Closs,  MD  methocarbamol (ROBAXIN) 500 MG tablet Take 1 tablet (500 mg total) by mouth 3 (three) times daily. 08/25/20   Raspet, Noberto Retort, PA-C  metoprolol tartrate (LOPRESSOR) 50 MG tablet Take 50 mg by mouth 2 (two) times daily.  09/19/18   [provider]  naproxen (NAPROSYN) 375 MG tablet Take 1 tablet (375 mg total) by mouth 2 (two) times daily. 08/25/20   Raspet, Noberto Retort, PA-C  nitroGLYCERIN (NITROSTAT) 0.4 MG SL tablet Place 1 tablet (0.4 mg total) under the tongue every 5 (five) minutes as needed for chest pain (up to  3 doses). 01/07/19   Terrilee Files, MD  Polyethyl Glycol-Propyl Glycol (SYSTANE) 0.4-0.3 % GEL ophthalmic gel Place 1 application into the right eye at bedtime. 02/09/18   Eustace Moore, MD  VITAMIN D, CHOLECALCIFEROL, PO Take by mouth.    [provider]    Family History Family History  Problem Relation Age of Onset   Diabetes Mother    Heart failure Father     Social History Social History   Tobacco Use   Smoking status: Never   Smokeless tobacco: Never  Vaping Use   Vaping Use: Never used  Substance Use Topics   Alcohol use: No   Drug use: No     Allergies   Food, Glipizide, Metformin and related, and Metformin   Review of Systems Review of Systems Per HPI  Physical Exam Triage Vital Signs ED Triage Vitals  Enc Vitals Group     BP 10/19/20 1814 (!) 161/90     Pulse Rate 10/19/20 1814 90     Resp 10/19/20 1814 18     Temp 10/19/20 1814 99.2 F (37.3 C)     Temp Source 10/19/20 1814 Oral     SpO2 10/19/20 1814 95 %     Weight --      Height --      Head Circumference --      Peak Flow --      Pain Score 10/19/20 1831 10     Pain Loc --      Pain Edu? --      Excl. in GC? --    No data found.  Updated Vital Signs BP (!) 161/90 (BP Location: Left Arm)   Pulse 90   Temp 99.2 F (37.3 C) (Oral)   Resp 18   SpO2 95%   Visual Acuity Right Eye Distance:   Left Eye Distance:   Bilateral Distance:    Right Eye Near:   Left Eye Near:    Bilateral Near:     Physical Exam Vitals and nursing note reviewed.  Constitutional:      Appearance: Normal appearance.  HENT:     Head: Atraumatic.  Eyes:     Extraocular Movements: Extraocular movements intact.     Conjunctiva/sclera: Conjunctivae normal.  Cardiovascular:     Rate and Rhythm: Normal rate and regular rhythm.  Pulmonary:     Effort: Pulmonary effort is normal.     Breath sounds: Normal breath sounds.  Abdominal:     General: Bowel sounds are normal.     Palpations:  Abdomen is soft.     Tenderness: There is no abdominal tenderness. There is no guarding.  Musculoskeletal:        General: Normal range of motion.     Cervical back: Normal range of motion and neck supple.  Lymphadenopathy:     Lower Body: Right inguinal adenopathy (focal) present.  Skin:  General: Skin is warm and dry.     Comments: Irregular shaped linear horizontal region of firmness right lower abdominal wall, no obvious appreciable erythema, no fluctuance  Neurological:     General: No focal deficit present.     Mental Status: He is oriented to person, place, and time.  Psychiatric:        Mood and Affect: Mood normal.        Thought Content: Thought content normal.        Judgment: Judgment normal.     UC Treatments / Results  Labs (all labs ordered are listed, but only abnormal results are displayed) Labs Reviewed - No data to display  EKG   Radiology No results found.  Procedures Procedures (including critical care time)  Medications Ordered in UC Medications - No data to display  Initial Impression / Assessment and Plan / UC Course  I have reviewed the triage vital signs and the nursing notes.  Pertinent labs & imaging results that were available during my care of the patient were reviewed by me and considered in my medical decision making (see chart for details).     His symptoms are possibly representative of an early abdominal wall abscess, however given the nonspecific nature of the findings today do not feel that it is safe or indicated to perform an I&D to the area on the assumption that this represents a simple superficial abscess.  Strongly recommended that he return to the emergency department where he was earlier this evening for imaging and further evaluation.  He is reluctantly agreeable and will go via private vehicle, hemodynamically stable for transport.  Final Clinical Impressions(s) / UC Diagnoses   Final diagnoses:  Abdominal wall pain   Inguinal lymphadenopathy   Discharge Instructions   None    ED Prescriptions   None    PDMP not reviewed this encounter.   Particia Nearing, [place] 10/19/20 1907

## 2020-10-19 NOTE — ED Notes (Signed)
Patient transported to CT 

## 2020-10-19 NOTE — ED Notes (Signed)
Patient is being discharged from the Urgent Care and sent to the Emergency Department via personal vehicle with family . Per Emerson Monte, patient is in need of higher level of care due to ABD wall pain and Inguinal Lymphadenopathy. Patient is aware and verbalizes understanding of plan of care.  Vitals:   10/19/20 1814  BP: (!) 161/90  Pulse: 90  Resp: 18  Temp: 99.2 F (37.3 C)  SpO2: 95%

## 2020-10-19 NOTE — ED Triage Notes (Signed)
Patient c/o RT sided ABD pain x 6 days.   Patient endorses increased pain with movement.   Patient has history of ABD abscess w/ previous drainage per patient statement.   Patient denies fever or drainage  Patient endorses chills  Patient has taken OTC "advil and numbing cream" w/ no relief of symptoms.

## 2020-10-19 NOTE — ED Triage Notes (Signed)
C/o abscess to right lower abd x 6 days, sent here from UC for ct scan d/t lymph node swelling

## 2020-10-19 NOTE — ED Provider Notes (Signed)
[facility] EMERGENCY DEPARTMENT Provider Note   CSN: [id number] Arrival date & time: 10/19/20  1910     History Chief Complaint  Patient presents with   Abscess    Javier Suarez is a 50 y.o. male who presents with right lower abdominal pain and firmness progressively worsening over the last 6 days.  He states he has had chills but no fever, sweats, body aches, nausea, or vomiting.  States he had an abscess in the past that required CT scan for evaluation in his lower central abdomen.  States that this feels the same when he is wanting it drained.  Was seen in urgent care, where they said he will require CT scan to further evaluate the depth of his infection.  I personally reviewed this patient's medical records.  He has history of type 2 diabetes on insulin, hypertension, panniculitis, and abdominal wall abscess.  He is not on any anticoagulation.  HPI     Past Medical History:  Diagnosis Date   Abscess    Diabetes mellitus without complication (HCC)    Gallstones    Hypertension     Patient Active Problem List   Diagnosis Date Noted   Type 2 diabetes mellitus with stage 3 chronic kidney disease (HCC) 03/14/2019   Uncontrolled type 2 diabetes mellitus with hyperglycemia, with long-term current use of insulin (HCC) 03/14/2019   Uncontrolled hypertension 03/14/2019   Panniculitis    Abdominal wall abscess 03/13/2019   Diabetes (HCC) 03/13/2019   Benign essential HTN 03/13/2019   Bilateral carpal tunnel syndrome 09/19/2017    Past Surgical History:  Procedure Laterality Date   CARDIAC SURGERY         Family History  Problem Relation Age of Onset   Diabetes Mother    Heart failure Father     Social History   Tobacco Use   Smoking status: Never   Smokeless tobacco: Never  Vaping Use   Vaping Use: Never used  Substance Use Topics   Alcohol use: No   Drug use: No    Home Medications Prior to Admission medications   Medication Sig Start Date  End Date Taking? Authorizing Provider  doxycycline (VIBRAMYCIN) 100 MG capsule Take 1 capsule (100 mg total) by mouth 2 (two) times daily for 10 days. 10/20/20 10/30/20 Yes Amie Cowens, Eugene Gavia, PA-C  aspirin EC 81 MG tablet Take 1 tablet by mouth daily.    [provider]  atorvastatin (LIPITOR) 20 MG tablet Take 20 mg by mouth daily. 02/28/19   [provider]  Cetirizine HCl 10 MG CAPS Take 1 capsule (10 mg total) by mouth daily. 11/25/19   Wieters, Hallie C, PA-C  empagliflozin (JARDIANCE) 25 MG TABS tablet Take 12.5 mg by mouth daily. Pt states he was instructed to break pill in half.    [provider]  fluticasone (FLONASE) 50 MCG/ACT nasal spray Place 1-2 sprays into both nostrils daily for 7 days. 11/25/19 12/02/19  Wieters, Hallie C, PA-C  insulin aspart (NOVOLOG) 100 UNIT/ML injection Inject 5 Units into the skin 3 (three) times daily before meals. 03/16/19   Hughie Closs, MD  insulin aspart (NOVOLOG) 100 UNIT/ML injection Inject 15 Units into the skin 3 (three) times daily before meals. Patient taking differently: Inject 10 Units into the skin 3 (three) times daily before meals. [facility] decreased from 15 to 10 units Jan 2021 03/16/19   Hughie Closs, MD  insulin glargine (LANTUS) 100 UNIT/ML injection Inject 0.5 mLs (50 Units total)  into the skin 2 (two) times daily. 03/16/19   Hughie Closs, MD  lisinopril-hydrochlorothiazide (ZESTORETIC) 20-12.5 MG tablet Take 1 tablet by mouth 2 (two) times daily. Patient taking differently: Take 1 tablet by mouth 2 (two) times daily.  03/16/19 04/15/19  Hughie Closs, MD  methocarbamol (ROBAXIN) 500 MG tablet Take 1 tablet (500 mg total) by mouth 3 (three) times daily. 08/25/20   Raspet, Noberto Retort, PA-C  metoprolol tartrate (LOPRESSOR) 50 MG tablet Take 50 mg by mouth 2 (two) times daily.  09/19/18   [provider]  naproxen (NAPROSYN) 375 MG tablet Take 1 tablet (375 mg total) by mouth 2 (two) times daily. 08/25/20    Raspet, Noberto Retort, PA-C  nitroGLYCERIN (NITROSTAT) 0.4 MG SL tablet Place 1 tablet (0.4 mg total) under the tongue every 5 (five) minutes as needed for chest pain (up to 3 doses). 01/07/19   Terrilee Files, MD  Polyethyl Glycol-Propyl Glycol (SYSTANE) 0.4-0.3 % GEL ophthalmic gel Place 1 application into the right eye at bedtime. 02/09/18   Eustace Moore, MD  VITAMIN D, CHOLECALCIFEROL, PO Take by mouth.    [provider]    Allergies    Food, Glipizide, Metformin and related, and Metformin  Review of Systems   Review of Systems  Constitutional:  Positive for appetite change. Negative for activity change, chills, diaphoresis, fatigue and fever.  HENT: Negative.    Respiratory: Negative.    Cardiovascular: Negative.   Gastrointestinal:  Positive for abdominal pain. Negative for abdominal distention, anal bleeding, blood in stool, constipation, diarrhea, nausea and vomiting.  Genitourinary: Negative.  Negative for decreased urine volume.  Musculoskeletal: Negative.   Skin:  Positive for rash.  Allergic/Immunologic: Positive for immunocompromised state.  Neurological: Negative.   Hematological: Negative.    Physical Exam Updated Vital Signs BP (!) 159/100   Pulse 84   Temp 99.5 F (37.5 C) (Oral)   Resp 20   Ht 6\' 1"  (1.854 m)   Wt 129.3 kg   SpO2 98%   BMI 37.60 kg/m   Physical Exam Vitals and nursing note reviewed.  Constitutional:      Appearance: He is obese. He is not ill-appearing or toxic-appearing.  HENT:     Head: Normocephalic and atraumatic.     Nose: Nose normal.     Mouth/Throat:     Mouth: Mucous membranes are moist.     Pharynx: Oropharynx is clear. Uvula midline. No oropharyngeal exudate, posterior oropharyngeal erythema or uvula swelling.     Tonsils: No tonsillar exudate.  Eyes:     General: Lids are normal. Vision grossly intact.        Right eye: No discharge.        Left eye: No discharge.     Extraocular Movements: Extraocular  movements intact.     Conjunctiva/sclera: Conjunctivae normal.     Pupils: Pupils are equal, round, and reactive to light.  Neck:     Trachea: Trachea and phonation normal.  Cardiovascular:     Rate and Rhythm: Normal rate and regular rhythm.     Pulses: Normal pulses.     Heart sounds: Normal heart sounds. No murmur heard. Pulmonary:     Effort: Pulmonary effort is normal. No tachypnea, bradypnea, accessory muscle usage, prolonged expiration or respiratory distress.     Breath sounds: Normal breath sounds. No wheezing or rales.  Chest:     Chest wall: No mass, lacerations, deformity, swelling, tenderness, crepitus or edema.  Abdominal:  General: Bowel sounds are normal. There is no distension.     Palpations: Abdomen is soft.     Tenderness: There is no abdominal tenderness. There is no right CVA tenderness, left CVA tenderness, guarding or rebound.    Musculoskeletal:        General: No deformity.     Cervical back: Normal range of motion and neck supple. No edema, rigidity or crepitus. No pain with movement or spinous process tenderness.     Right lower leg: No edema.     Left lower leg: No edema.  Lymphadenopathy:     Cervical: No cervical adenopathy.     Lower Body: Right inguinal adenopathy present.  Skin:    General: Skin is warm and dry.  Neurological:     Mental Status: He is alert. Mental status is at baseline.  Psychiatric:        Mood and Affect: Mood normal.    ED Results / Procedures / Treatments   Labs (all labs ordered are listed, but only abnormal results are displayed) Labs Reviewed  CBC WITH DIFFERENTIAL/PLATELET - Abnormal; Notable for the following components:      Result Value   WBC 11.6 (*)    Neutro Abs 8.7 (*)    All other components within normal limits  COMPREHENSIVE METABOLIC PANEL - Abnormal; Notable for the following components:   Glucose, Bld 196 (*)    BUN 22 (*)    Creatinine, Ser 1.69 (*)    Total Protein 8.4 (*)    GFR, Estimated  50 (*)    All other components within normal limits    EKG None  Radiology CT Abdomen Pelvis W Contrast  Result Date: 10/19/2020 CLINICAL DATA:  Right lower quadrant pain. EXAM: CT ABDOMEN AND PELVIS WITH CONTRAST TECHNIQUE: Multidetector CT imaging of the abdomen and pelvis was performed using the standard protocol following bolus administration of intravenous contrast. CONTRAST:  OMNIPAQUE IOHEXOL 300 MG/ML  SOLN COMPARISON:  CT abdomen pelvis 03/13/2019 FINDINGS: Lower chest: Persistent bilateral lower lobe and lingular atelectasis/scarring. Coronary artery calcifications. Hepatobiliary: No focal liver abnormality. No gallstones, gallbladder wall thickening, or pericholecystic fluid. No biliary dilatation. Pancreas: No focal lesion. Normal pancreatic contour. No surrounding inflammatory changes. No main pancreatic ductal dilatation. Spleen: Normal in size without focal abnormality. Adrenals/Urinary Tract: There is a similar-appearing 1.6 cm right adrenal gland nodule with a density of 27 Hounsfield units. No left adrenal nodularity. Bilateral kidneys enhance symmetrically. No hydronephrosis. No hydroureter. The urinary bladder is unremarkable. Stomach/Bowel: Stomach is within normal limits. No evidence of bowel wall thickening or dilatation. Appendix appears normal. Vascular/Lymphatic: No significant vascular findings are present. No enlarged abdominal or pelvic lymph nodes. Reproductive: Prostate is unremarkable. Other: No intraperitoneal free fluid. No intraperitoneal free gas. No organized fluid collection. Musculoskeletal: Interval development of an approximately 6 x 5.5 cm region of subcutaneus tissue edema and possible phlegmon formation within the right pelvic subcutaneus soft tissues (2:97). No definite associated foci of subcutaneus soft tissue gas. No organized fluid collection. No suspicious lytic or blastic osseous lesions. No acute displaced fracture. IMPRESSION: 1. Interval  development of an approximately 6 x 5.5 cm region of edema/infection and possible phlegmon formation within the right pelvic subcutaneus soft tissues. No definite emphysema or organized fluid collection. 2. Stable in size indeterminate 1.6 cm right adrenal gland nodule. Finding likely represents an adrenal adenoma given its stability. Electronically Signed   By: Tish Frederickson M.D.   On: 10/19/2020 22:55    Procedures  Procedures   Medications Ordered in ED Medications  ketorolac (TORADOL) 15 MG/ML injection 15 mg (15 mg Intravenous Given 10/19/20 2222)  iohexol (OMNIPAQUE) 300 MG/ML solution 100 mL (100 mLs Intravenous Contrast Given 10/19/20 2234)  lactated ringers bolus 500 mL (500 mLs Intravenous New Bag/Given 10/19/20 2331)  doxycycline (VIBRA-TABS) tablet 100 mg (100 mg Oral Given 10/20/20 0015)    ED Course  I have reviewed the triage vital signs and the nursing notes.  Pertinent labs & imaging results that were available during my care of the patient were reviewed by me and considered in my medical decision making (see chart for details).    MDM Rules/Calculators/A&P                         50 year old male with history of type 2 diabetes who presents with concern for several days of progressively worsening right lower abdominal pain and tenderness palpation of the skin, history of recurrent cellulitis.  Differential diagnosis includes is limited to cellulitis, panniculitis, abscess, erysipelas, intertrigo.  Hypertensive on intake, vital signs otherwise normal.  Cardiopulmonary exam is normal, abdominal exam with large linear area of firmness to palpation and erythema with associated tenderness palpation without area of fluctuance or draining sinus tract.  CBC with mild leukocytosis of 11.6, CMP with mild AKI with creatinine 1.69 increased from baseline of 1.2.  CT of the abdomen and pelvis obtained to evaluate for deep  space infection.  Revealed approximately 6 x 5.5 cm region of  edema/infection suggestive of possible developing phlegmon formation within the pelvic subcutaneous soft tissues on the right without organized fluid collection.  Patient evaluated the bedside as well with bedside ultrasound, which revealed very small fluid collection, however not amenable to drainage due to small size.  Will administer first dose of oral antibiotics in the ER and discharged on course of antibiotics with close outpatient follow-up.  No further work-up is warranted in the ED at this time given reassuring laboratory studies and imaging.  Viviann SpareSteven voiced understanding of his medical evaluation and treatment plan.  Each of his questions was answered to his expressed satisfaction.  Return precautions given.  Patient is well-appearing, stable, and appropriate for discharge at this time.  This chart was dictated using voice recognition software, Dragon. Despite the best efforts of this provider to proofread and correct errors, errors may still occur which can change documentation meaning.  Final Clinical Impression(s) / ED Diagnoses Final diagnoses:  Panniculitis    Rx / DC Orders ED Discharge Orders          Ordered    doxycycline (VIBRAMYCIN) 100 MG capsule  2 times daily        10/20/20 0017             Rovena Hearld, Eugene GaviaRebekah R, PA-C 10/20/20 0028    Terrilee FilesButler, Michael C, MD 10/20/20 1051

## 2020-10-20 MED ORDER — DOXYCYCLINE HYCLATE 100 MG PO CAPS: 100.0000 mg | ORAL_CAPSULE | Freq: Two times a day (BID) | ORAL | 0 refills | Status: AC

## 2020-10-20 MED ORDER — DOXYCYCLINE HYCLATE 100 MG PO TABS
100.0000 mg | ORAL_TABLET | Freq: Once | ORAL | Status: AC
  Administered 2020-10-20: 100 mg via ORAL
  Filled 2020-10-20: qty 1

## 2020-10-20 NOTE — Discharge Instructions (Addendum)
You are seen here today for acute the pain and redness to your lower abdomen.  Your CT scan was reassuring and that did not reveal any abscess, which is a collection of pus.  You do have infection of the skin in that area, however there is no area of pus that was drainable in the ER today.  You have been prescribed an antibiotic called doxycycline to take twice a day for the next 10 days.  Please take it as prescribed for the entire course.  Please be aware this antibiotic may make you more sensitive to the sun.   Please follow up with your PCP in the next week, and return to the ER if you develop and worsening abdominal pain, nausea, vomiting, fevers, chills, or any other new severe symptoms.

## 2020-10-20 NOTE — ED Notes (Signed)
Pt provided discharge instructions and prescription information. Pt was given the opportunity to ask questions and questions were answered. Discharge signature not obtained in the setting of the COVID-19 pandemic in order to reduce high touch surfaces.  ° °

## 2021-04-03 ENCOUNTER — Encounter (HOSPITAL_COMMUNITY): Payer: Self-pay

## 2021-04-03 ENCOUNTER — Other Ambulatory Visit: Payer: Self-pay

## 2021-04-03 ENCOUNTER — Ambulatory Visit (HOSPITAL_COMMUNITY)
Admission: EM | Admit: 2021-04-03 | Discharge: 2021-04-03 | Disposition: A | Discharge status: Home / Self Care | Payer: No Typology Code available for payment source | Attending: Family Medicine | Admitting: Family Medicine

## 2021-04-03 DIAGNOSIS — J069 Acute upper respiratory infection, unspecified: Secondary | ICD-10-CM | POA: Diagnosis not present

## 2021-04-03 MED ORDER — PROMETHAZINE-DM 6.25-15 MG/5ML PO SYRP
5.0000 mL | ORAL_SOLUTION | Freq: Four times a day (QID) | ORAL | 0 refills | Status: DC | PRN
Start: 2021-04-03 — End: 2021-06-28

## 2021-04-03 NOTE — ED Triage Notes (Signed)
C/O HA, body aches, loss of appetite and productive cough with greenish colored sputum. Denies fever.

## 2021-04-03 NOTE — ED Provider Notes (Signed)
°  [facility]   [id number] 04/03/21 Arrival Time: 1122  ASSESSMENT & PLAN:  1. Viral URI with cough     Discussed typical duration of viral illnesses. Work note provided. OTC symptom care as needed.  New Prescriptions   PROMETHAZINE-DEXTROMETHORPHAN (PROMETHAZINE-DM) 6.25-15 MG/5ML SYRUP    Take 5 mLs by mouth 4 (four) times daily as needed for cough.     Follow-up Information     [facility]   Specialty: General Practice Why: As needed. Contact information: [address] [place] [address] [phone]                 Reviewed expectations re: course of current medical issues. Questions answered. Outlined signs and symptoms indicating need for more acute intervention. Understanding verbalized. After Visit Summary given.   SUBJECTIVE: History from: patient. Javier Suarez is a 50 y.o. male who reports: HA, body aches, prod cough; x 2 days; wife with same. Denies: fever and difficulty breathing. Normal PO intake without n/v/d.  OBJECTIVE:  Vitals:   04/03/21 1341  BP: 137/68  Pulse: 92  Resp: 18  Temp: 98.3 F (36.8 C)  TempSrc: Oral  SpO2: 98%    General appearance: alert; no distress Eyes: PERRLA; EOMI; conjunctiva normal HENT: [place]; AT; with nasal congestion Neck: supple  Lungs: speaks full sentences without difficulty; unlabored; clear Extremities: no edema Skin: warm and dry Neurologic: normal gait Psychological: alert and cooperative; normal mood and affect   Allergies  Allergen Reactions   Food     Coconut. Swelling    Glipizide    Metformin And Related Diarrhea   Metformin Diarrhea    Past Medical History:  Diagnosis Date   Abscess    Diabetes mellitus without complication (HCC)    Gallstones    Hypertension    Social History   Socioeconomic History   Marital status: Single    Spouse name: Not on file   Number of children: Not on file   Years of education: Not on file   Highest education  level: Not on file  Occupational History   Not on file  Tobacco Use   Smoking status: Never   Smokeless tobacco: Never  Vaping Use   Vaping Use: Never used  Substance and Sexual Activity   Alcohol use: No   Drug use: No   Sexual activity: Not on file  Other Topics Concern   Not on file  Social History Narrative   Not on file   Social Determinants of Health   Financial Resource Strain: Not on file  Food Insecurity: Not on file  Transportation Needs: Not on file  Physical Activity: Not on file  Stress: Not on file  Social Connections: Not on file  Intimate Partner Violence: Not on file   Family History  Problem Relation Age of Onset   Diabetes Mother    Heart failure Father    Past Surgical History:  Procedure Laterality Date   CARDIAC SURGERY       Mardella Layman, MD 04/03/21 [phone]

## 2021-06-28 ENCOUNTER — Other Ambulatory Visit: Payer: Self-pay

## 2021-06-28 ENCOUNTER — Encounter (HOSPITAL_COMMUNITY): Payer: Self-pay | Admitting: Emergency Medicine

## 2021-06-28 ENCOUNTER — Ambulatory Visit (HOSPITAL_COMMUNITY)
Admission: EM | Admit: 2021-06-28 | Discharge: 2021-06-28 | Disposition: A | Discharge status: Home / Self Care | Payer: No Typology Code available for payment source | Attending: Physician Assistant | Admitting: Physician Assistant

## 2021-06-28 DIAGNOSIS — J069 Acute upper respiratory infection, unspecified: Secondary | ICD-10-CM | POA: Diagnosis not present

## 2021-06-28 DIAGNOSIS — R52 Pain, unspecified: Secondary | ICD-10-CM | POA: Diagnosis present

## 2021-06-28 DIAGNOSIS — Z20822 Contact with and (suspected) exposure to covid-19: Secondary | ICD-10-CM | POA: Insufficient documentation

## 2021-06-28 DIAGNOSIS — R051 Acute cough: Secondary | ICD-10-CM | POA: Insufficient documentation

## 2021-06-28 DIAGNOSIS — J029 Acute pharyngitis, unspecified: Secondary | ICD-10-CM

## 2021-06-28 DIAGNOSIS — Z8616 Personal history of COVID-19: Secondary | ICD-10-CM | POA: Insufficient documentation

## 2021-06-28 LAB — SARS CORONAVIRUS 2 (TAT 6-24 HRS): SARS Coronavirus 2: NEGATIVE

## 2021-06-28 LAB — POCT RAPID STREP A, ED / UC: Streptococcus, Group A Screen (Direct): NEGATIVE

## 2021-06-28 MED ORDER — PROMETHAZINE-DM 6.25-15 MG/5ML PO SYRP: 5.0000 mL | ORAL_SOLUTION | Freq: Four times a day (QID) | ORAL | 0 refills | Status: AC | PRN

## 2021-06-28 NOTE — ED Provider Notes (Signed)
?Javier Suarez ? ? ? ?CSN: [id number] ?Arrival date & time: 06/28/21  [id number] ? ? ?  ? ?History   ?Chief Complaint ?Chief Complaint  ?Patient presents with  ? Sore Throat  ? Neck Pain  ? ? ?HPI ?Javier Suarez is a 51 y.o. male.  ? ?Patient presents today accompanied by his daughter with a 3 to 4-day history of URI symptoms.  Reports body aches, headache, sore throat, cough, fatigue, malaise.  Denies any chest pain, shortness of breath, abdominal pain, nausea, vomiting, diarrhea.  He has tried TheraFlu and cough medicine with minimal improvement of symptoms.  Reports daughter has been sick with similar symptoms but does not know her official diagnosis.  He has had COVID in 2021.  He has had COVID-19 vaccines.  He denies history of asthma, allergies, COPD, smoking.  Denies any recent antibiotic use.  He is eating and drinking despite sore throat symptoms. ? ? ?Past Medical History:  ?Diagnosis Date  ? Abscess   ? Diabetes mellitus without complication ([place])   ? Gallstones   ? Hypertension   ? ? ?Patient Active Problem List  ? Diagnosis Date Noted  ? Type 2 diabetes mellitus with stage 3 chronic kidney disease ([place]) 03/14/2019  ? Uncontrolled type 2 diabetes mellitus with hyperglycemia, with long-term current use of insulin ([place]) 03/14/2019  ? Uncontrolled hypertension 03/14/2019  ? Panniculitis   ? Abdominal wall abscess 03/13/2019  ? Diabetes ([place]) 03/13/2019  ? Benign essential HTN 03/13/2019  ? Bilateral carpal tunnel syndrome 09/19/2017  ? ? ?Past Surgical History:  ?Procedure Laterality Date  ? CARDIAC SURGERY    ? ? ? ? ? ?Home Medications   ? ?Prior to Admission medications   ?Medication Sig Start Date End Date Taking? Authorizing Provider  ?aspirin EC 81 MG tablet Take 1 tablet by mouth daily.    [provider]  ?atorvastatin (LIPITOR) 20 MG tablet Take 20 mg by mouth daily. 02/28/19   [provider]  ?insulin aspart (NOVOLOG) 100 UNIT/ML injection Inject 5 Units into the skin 3  (three) times daily before meals. 03/16/19   Darliss Cheney, MD  ?insulin aspart (NOVOLOG) 100 UNIT/ML injection Inject 15 Units into the skin 3 (three) times daily before meals. ?Patient taking differently: Inject 10 Units into the skin 3 (three) times daily before meals. [place] endocrinologist decreased from 15 to 10 units Jan 2021 03/16/19   Darliss Cheney, MD  ?insulin glargine (LANTUS) 100 UNIT/ML injection Inject 0.5 mLs (50 Units total) into the skin 2 (two) times daily. 03/16/19   Darliss Cheney, MD  ?lisinopril-hydrochlorothiazide (ZESTORETIC) 20-12.5 MG tablet Take 1 tablet by mouth 2 (two) times daily. ?Patient taking differently: Take 1 tablet by mouth 2 (two) times daily. 03/16/19 04/15/19  Darliss Cheney, MD  ?metoprolol tartrate (LOPRESSOR) 50 MG tablet Take 50 mg by mouth 2 (two) times daily.  09/19/18   [provider]  ?naproxen (NAPROSYN) 375 MG tablet Take 1 tablet (375 mg total) by mouth 2 (two) times daily. 08/25/20   Mikaila Grunert, Derry Skill, PA-C  ?nitroGLYCERIN (NITROSTAT) 0.4 MG SL tablet Place 1 tablet (0.4 mg total) under the tongue every 5 (five) minutes as needed for chest pain (up to 3 doses). 01/07/19   Hayden Rasmussen, MD  ?promethazine-dextromethorphan (PROMETHAZINE-DM) 6.25-15 MG/5ML syrup Take 5 mLs by mouth 4 (four) times daily as needed for cough. 06/28/21   Bralee Feldt K, PA-C  ?VITAMIN D, CHOLECALCIFEROL, PO Take by mouth.    [provider]  ? ? ?  Family History ?Family History  ?Problem Relation Age of Onset  ? Diabetes Mother   ? Heart failure Father   ? ? ?Social History ?Social History  ? ?Tobacco Use  ? Smoking status: Never  ? Smokeless tobacco: Never  ?Vaping Use  ? Vaping Use: Never used  ?Substance Use Topics  ? Alcohol use: No  ? Drug use: No  ? ? ? ?Allergies   ?Food, Glipizide, Metformin and related, and Metformin ? ? ?Review of Systems ?Review of Systems  ?Constitutional:  Positive for activity change and fatigue. Negative for appetite change and fever.  ?HENT:   Positive for postnasal drip and sore throat. Negative for congestion, sinus pressure and sneezing.   ?Respiratory:  Positive for cough. Negative for shortness of breath.   ?Cardiovascular:  Negative for chest pain.  ?Gastrointestinal:  Negative for abdominal pain, diarrhea, nausea and vomiting.  ?Musculoskeletal:  Positive for arthralgias and myalgias.  ?Neurological:  Positive for headaches. Negative for dizziness and light-headedness.  ? ? ?Physical Exam ?Triage Vital Signs ?ED Triage Vitals  ?Enc Vitals Group  ?   BP 06/28/21 1004 (!) 145/97  ?   Pulse Rate 06/28/21 1004 80  ?   Resp 06/28/21 1004 17  ?   Temp 06/28/21 1004 98.1 ?F (36.7 ?C)  ?   Temp Source 06/28/21 1004 Oral  ?   SpO2 06/28/21 1004 97 %  ?   Weight --   ?   Height --   ?   Head Circumference --   ?   Peak Flow --   ?   Pain Score 06/28/21 1002 8  ?   Pain Loc --   ?   Pain Edu? --   ?   Excl. in [place]? --   ? ?No data found. ? ?Updated Vital Signs ?BP (!) 145/97 (BP Location: Right Arm)   Pulse 80   Temp 98.1 ?F (36.7 ?C) (Oral)   Resp 17   SpO2 97%  ? ?Visual Acuity ?Right Eye Distance:   ?Left Eye Distance:   ?Bilateral Distance:   ? ?Right Eye Near:   ?Left Eye Near:    ?Bilateral Near:    ? ?Physical Exam ?Vitals reviewed.  ?Constitutional:   ?   General: He is awake.  ?   Appearance: Normal appearance. He is well-developed. He is not ill-appearing.  ?   Comments: Very pleasant male appears stated age in no acute distress sitting comfortably in exam room  ?HENT:  ?   Head: Normocephalic and atraumatic.  ?   Right Ear: Tympanic membrane, ear canal and external ear normal. Tympanic membrane is not erythematous or bulging.  ?   Left Ear: Tympanic membrane, ear canal and external ear normal. Tympanic membrane is not erythematous or bulging.  ?   Nose: Nose normal.  ?   Mouth/Throat:  ?   Pharynx: Uvula midline. Posterior oropharyngeal erythema present. No oropharyngeal exudate or uvula swelling.  ?   Tonsils: No tonsillar exudate or tonsillar  abscesses.  ?Cardiovascular:  ?   Rate and Rhythm: Normal rate and regular rhythm.  ?   Heart sounds: Normal heart sounds, S1 normal and S2 normal. No murmur heard. ?Pulmonary:  ?   Effort: Pulmonary effort is normal. No accessory muscle usage or respiratory distress.  ?   Breath sounds: Normal breath sounds. No stridor. No wheezing, rhonchi or rales.  ?   Comments: Clear to auscultation bilaterally ?Abdominal:  ?   General: Bowel sounds are  normal.  ?   Palpations: Abdomen is soft.  ?   Tenderness: There is no abdominal tenderness.  ?Lymphadenopathy:  ?   Head:  ?   Right side of head: No submental, submandibular or tonsillar adenopathy.  ?   Left side of head: No submental, submandibular or tonsillar adenopathy.  ?   Cervical: No cervical adenopathy.  ?Neurological:  ?   Mental Status: He is alert.  ?Psychiatric:     ?   Behavior: Behavior is cooperative.  ? ? ? ?UC Treatments / Results  ?Labs ?(all labs ordered are listed, but only abnormal results are displayed) ?Labs Reviewed  ?SARS CORONAVIRUS 2 (TAT 6-24 HRS)  ?CULTURE, GROUP A STREP [facility])  ?POCT RAPID STREP A, ED / UC  ? ? ?EKG ? ? ?Radiology ?No results found. ? ?Procedures ?Procedures (including critical care time) ? ?Medications Ordered in UC ?Medications - No data to display ? ?Initial Impression / Assessment and Plan / UC Course  ?I have reviewed the triage vital signs and the nursing notes. ? ?Pertinent labs & imaging results that were available during my care of the patient were reviewed by me and considered in my medical decision making (see chart for details). ? ?  ? ?Vital signs and physical exam reassuring today; no indication for emergent evaluation or imaging.  Strep test was negative.  Throat culture is pending.  Will defer antibiotics until throat culture is available.  Discussed likely viral etiology of symptoms.  No indication for flu testing as patient has been symptomatic for more than 72 hours.  COVID testing was obtained-results  pending.  He was provided work excuse note with current CDC return to work guidelines based on COVID test result.  Recommended conservative treatment measures including over-the-counter medication for symptom managemen

## 2021-06-28 NOTE — ED Triage Notes (Signed)
Pt c/o sore throat, neck, headache and back pains for couple days.  ?

## 2021-06-28 NOTE — Discharge Instructions (Signed)
Your strep test was negative.  We will contact you if your throat culture is positive we will need to start antibiotics.  We will contact you if your COVID test is positive.  Please monitor your MyChart for these results.  I believe that you have a virus.  Please drink plenty fluid and rest.  Use Tylenol and ibuprofen for pain and fever.  Use a humidifier to help with cough.  I have called in a refill of Promethazine DM for cough.  This can make you sleepy so do not drive or drink alcohol while taking it.  If symptoms are not improving by next week please return here or see your PCP.  If anything worsens you have high fever, difficulty swallowing, muffled voice, swelling of your throat, shortness of breath, chest pain you need to go to the emergency room. ?

## 2021-06-30 LAB — CULTURE, GROUP A STREP (THRC)

## 2023-01-03 IMAGING — CT CT ABD-PELV W/ CM
2 of 5 series · 16 of 46 positions shown, 18 images · IV contrast (Omnipaque)
Comparison: CT abdomen pelvis 03/13/2019

CLINICAL DATA: Right lower quadrant pain.

EXAM:
CT ABDOMEN AND PELVIS WITH CONTRAST
TECHNIQUE: Multidetector CT imaging of the abdomen and pelvis was performed
using the standard protocol following bolus administration of
intravenous contrast.
CONTRAST:  100mL OMNIPAQUE IOHEXOL 300 MG/ML  SOLN

[Series 2: axial st · axial · 0.98mm/px · z∈[+696,+1190]mm · 13 of 111 slices shown, 15 images]
[im 6/111  soft-tissue]
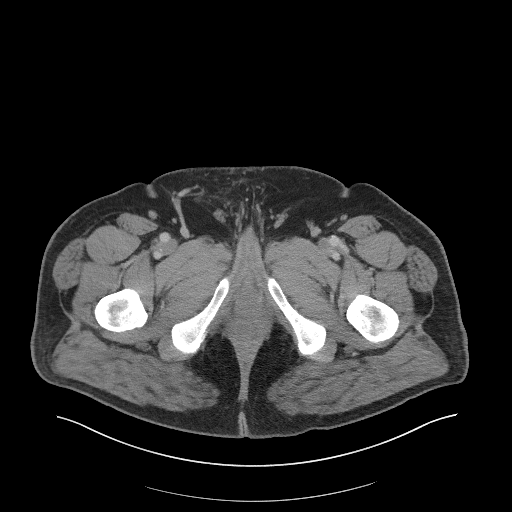
[im 6/111  bone]
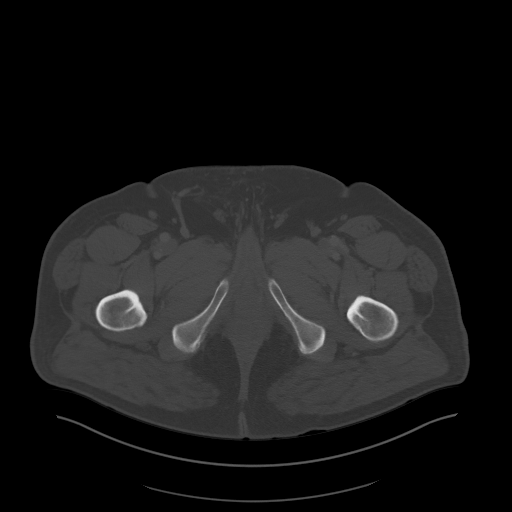
[im 18/111  soft-tissue]
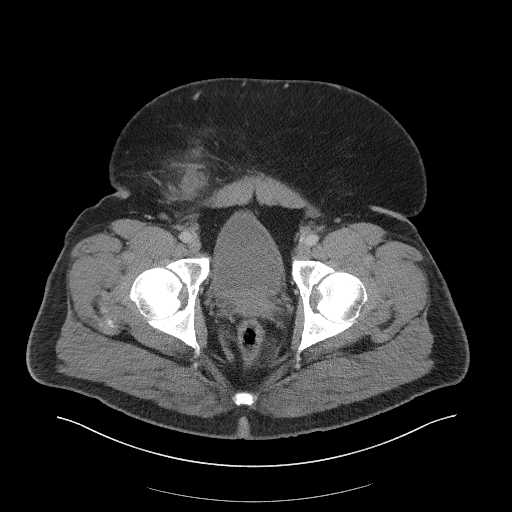
[im 24/111  soft-tissue]
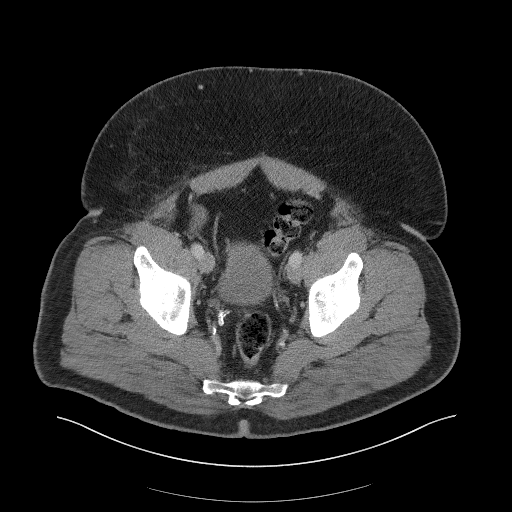
[im 29/111  soft-tissue]
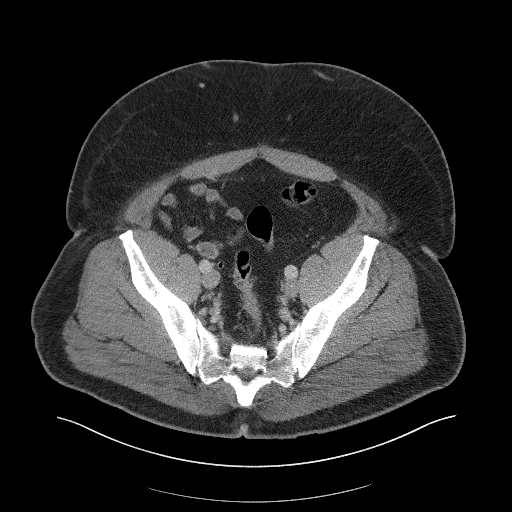
[im 41/111  soft-tissue]
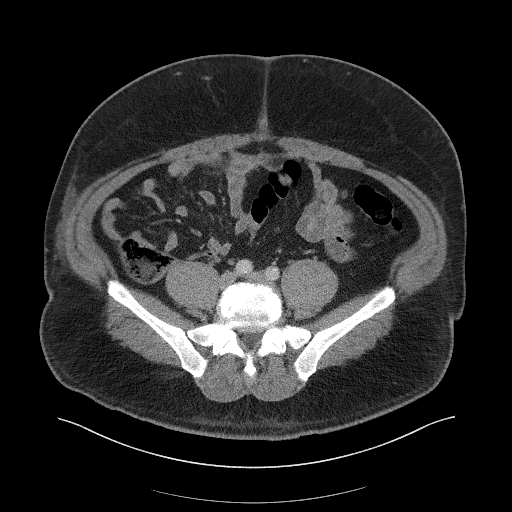
[im 47/111  soft-tissue]
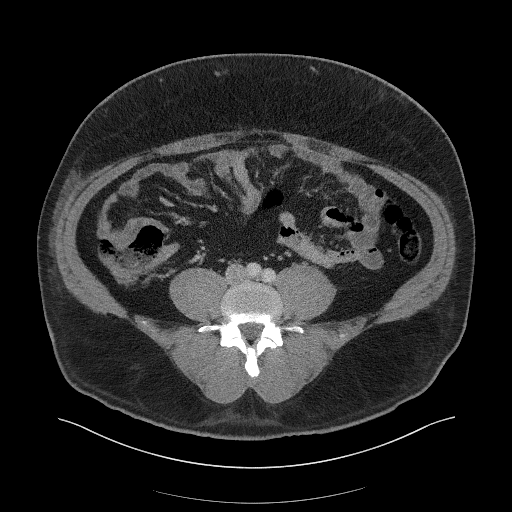
[im 58/111  soft-tissue]
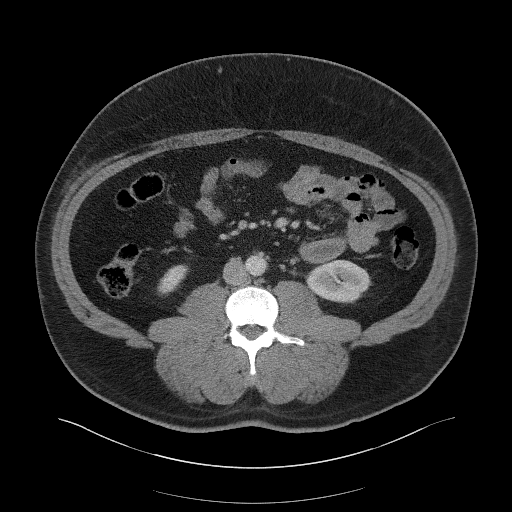
[im 64/111  soft-tissue]
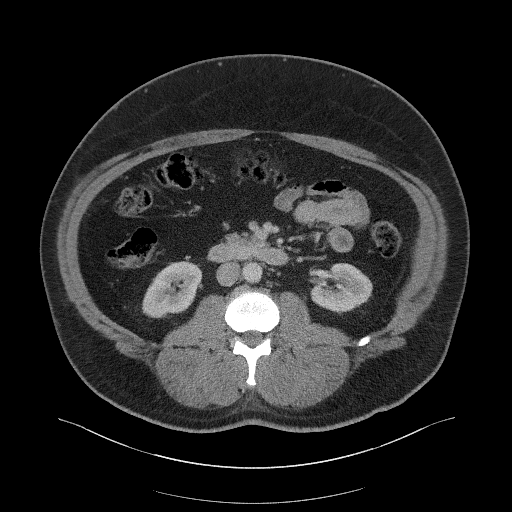
[im 70/111  soft-tissue]
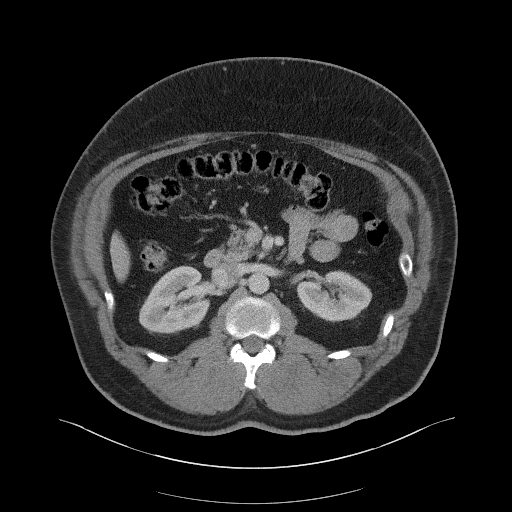
[im 70/111  bone]
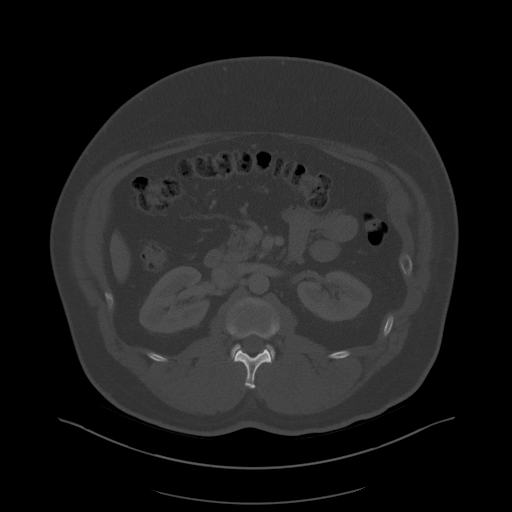
[im 82/111  soft-tissue]
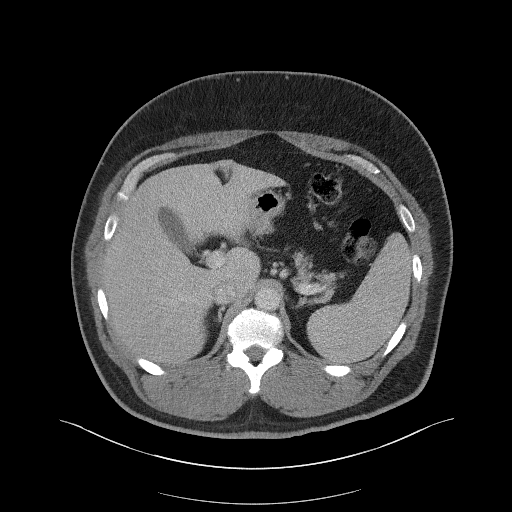
[im 87/111  soft-tissue]
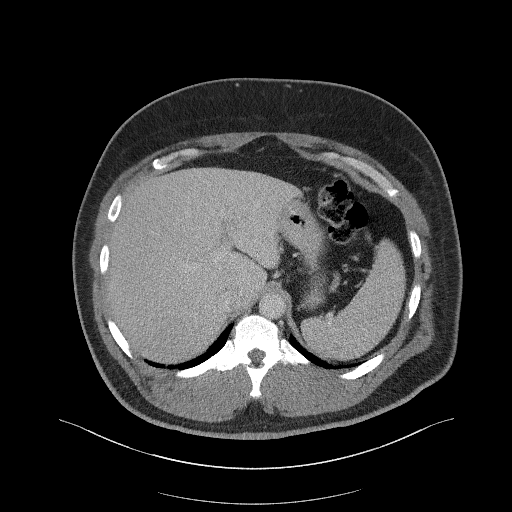
[im 93/111  soft-tissue]
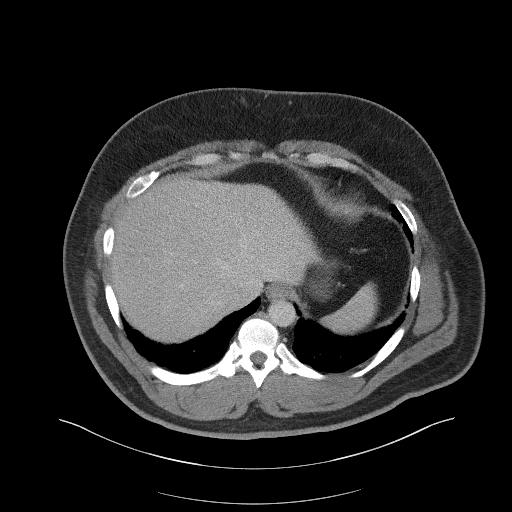
[im 105/111  soft-tissue]
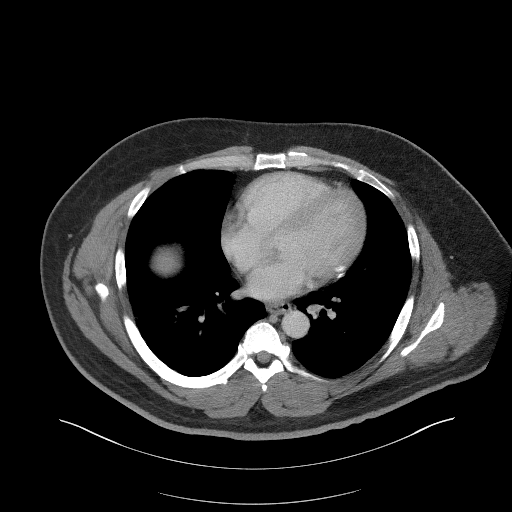

[Series 5: coronal st · coronal · 0.88mm/px · 3 of 101 slices shown]
[im 34/101  soft-tissue]
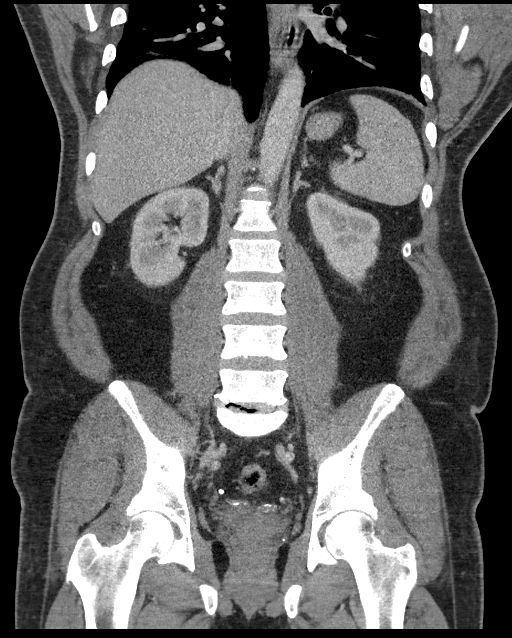
[im 45/101  soft-tissue]
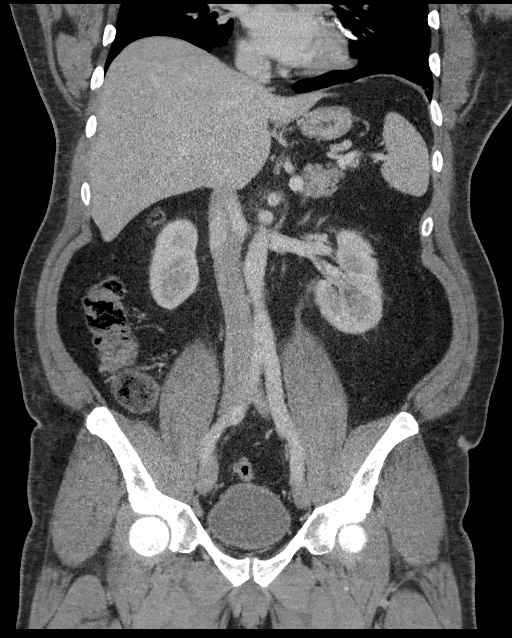
[im 56/101  soft-tissue]
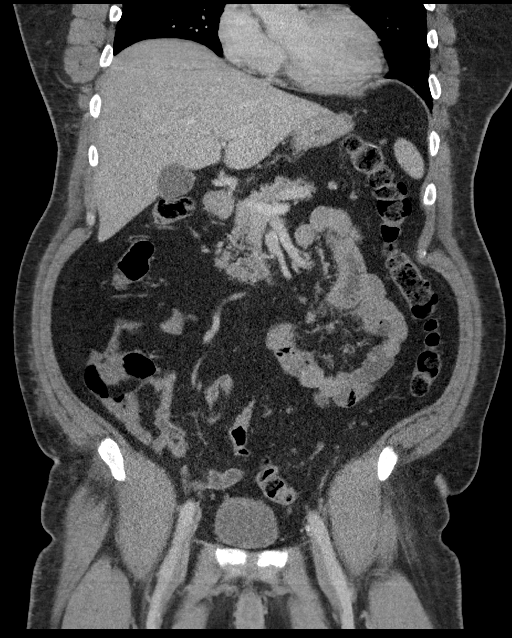

[16 of 46 positions shown; findings below may reference images not displayed]

FINDINGS: Lower chest: Persistent bilateral lower lobe and lingular
atelectasis/scarring. Coronary artery calcifications.

Hepatobiliary: No focal liver abnormality. No gallstones,
gallbladder wall thickening, or pericholecystic fluid. No biliary
dilatation.

Pancreas: No focal lesion. Normal pancreatic contour. No surrounding
inflammatory changes. No main pancreatic ductal dilatation.

Spleen: Normal in size without focal abnormality.

Adrenals/Urinary Tract:

There is a similar-appearing 1.6 cm right adrenal gland nodule with
a density of 27 Hounsfield units. No left adrenal nodularity.

Bilateral kidneys enhance symmetrically. No hydronephrosis. No
hydroureter.

The urinary bladder is unremarkable.

Stomach/Bowel: Stomach is within normal limits. No evidence of bowel
wall thickening or dilatation. Appendix appears normal.

Vascular/Lymphatic: No significant vascular findings are present. No
enlarged abdominal or pelvic lymph nodes.

Reproductive: Prostate is unremarkable.

Other: No intraperitoneal free fluid. No intraperitoneal free gas.
No organized fluid collection.

Musculoskeletal:

Interval development of an approximately 6 x 5.5 cm region of
subcutaneus tissue edema and possible phlegmon formation within the
right pelvic subcutaneus soft tissues ([DATE]). No definite associated
foci of subcutaneus soft tissue gas. No organized fluid collection.

No suspicious lytic or blastic osseous lesions. No acute displaced
fracture.
IMPRESSION: 1. Interval development of an approximately 6 x 5.5 cm region of
edema/infection and possible phlegmon formation within the right
pelvic subcutaneus soft tissues. No definite emphysema or organized
fluid collection.
2. Stable in size indeterminate 1.6 cm right adrenal gland nodule.
Finding likely represents an adrenal adenoma given its stability.

## 2024-01-24 ENCOUNTER — Emergency Department (HOSPITAL_BASED_OUTPATIENT_CLINIC_OR_DEPARTMENT_OTHER)

## 2024-01-24 ENCOUNTER — Other Ambulatory Visit: Payer: Self-pay

## 2024-01-24 ENCOUNTER — Emergency Department (HOSPITAL_BASED_OUTPATIENT_CLINIC_OR_DEPARTMENT_OTHER): Admission: EM | Admit: 2024-01-24 | Discharge: 2024-01-24 | Disposition: A | Discharge status: Home / Self Care

## 2024-01-24 DIAGNOSIS — Z794 Long term (current) use of insulin: Secondary | ICD-10-CM | POA: Diagnosis not present

## 2024-01-24 DIAGNOSIS — Z79899 Other long term (current) drug therapy: Secondary | ICD-10-CM | POA: Diagnosis not present

## 2024-01-24 DIAGNOSIS — S8391XA Sprain of unspecified site of right knee, initial encounter: Secondary | ICD-10-CM | POA: Diagnosis not present

## 2024-01-24 DIAGNOSIS — Y92410 Unspecified street and highway as the place of occurrence of the external cause: Secondary | ICD-10-CM | POA: Diagnosis not present

## 2024-01-24 DIAGNOSIS — M25551 Pain in right hip: Secondary | ICD-10-CM | POA: Diagnosis not present

## 2024-01-24 DIAGNOSIS — I10 Essential (primary) hypertension: Secondary | ICD-10-CM | POA: Diagnosis not present

## 2024-01-24 DIAGNOSIS — E119 Type 2 diabetes mellitus without complications: Secondary | ICD-10-CM | POA: Insufficient documentation

## 2024-01-24 DIAGNOSIS — Z7982 Long term (current) use of aspirin: Secondary | ICD-10-CM | POA: Diagnosis not present

## 2024-01-24 DIAGNOSIS — S8392XA Sprain of unspecified site of left knee, initial encounter: Secondary | ICD-10-CM

## 2024-01-24 DIAGNOSIS — M25561 Pain in right knee: Secondary | ICD-10-CM | POA: Diagnosis present

## 2024-01-24 MED ORDER — OXYCODONE-ACETAMINOPHEN 5-325 MG PO TABS
1.0000 | ORAL_TABLET | Freq: Once | ORAL | Status: AC
  Administered 2024-01-24: 1 via ORAL
  Filled 2024-01-24: qty 1

## 2024-01-24 MED ORDER — DICLOFENAC SODIUM 1 % EX GEL: 4.0000 g | Freq: Four times a day (QID) | CUTANEOUS | 0 refills | Status: AC

## 2024-01-24 MED ORDER — LIDOCAINE 5 % EX PTCH: 1.0000 | MEDICATED_PATCH | CUTANEOUS | 0 refills | Status: DC

## 2024-01-24 MED ORDER — DICLOFENAC SODIUM 1 % EX GEL: 4.0000 g | Freq: Four times a day (QID) | CUTANEOUS | 0 refills | Status: DC

## 2024-01-24 MED ORDER — LIDOCAINE 5 % EX PTCH: 1.0000 | MEDICATED_PATCH | CUTANEOUS | 0 refills | Status: AC

## 2024-01-24 NOTE — Discharge Instructions (Signed)
 Your x-rays did show some arthritis.  Based on your most recent kidney function I think it is safer to do some topical medications.  Please try the Voltaren gel and lighted Derm patches.  You may also take Tylenol  as needed for pain.  Follow-up with your doctor and return to the ER for worsening symptoms.
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
## 2024-01-24 NOTE — ED Notes (Signed)
 Pt alert and oriented X 4 at the time of discharge. RR even and unlabored. No acute distress noted. Pt verbalized understanding of discharge instructions as discussed. Pt ambulatory to lobby at time of discharge.
